# Patient Record
Sex: Female | Born: 1980 | Race: Black or African American | Hispanic: No | Marital: Single | Smoking: Never smoker
Health system: Southern US, Community
[De-identification: ages and names within clinical notes are randomized; demographics above are authoritative.]

## PROBLEM LIST (undated history)

## (undated) ENCOUNTER — Inpatient Hospital Stay (HOSPITAL_COMMUNITY): Payer: Self-pay

## (undated) DIAGNOSIS — F4310 Post-traumatic stress disorder, unspecified: Secondary | ICD-10-CM

## (undated) DIAGNOSIS — F603 Borderline personality disorder: Secondary | ICD-10-CM

---

## 2011-02-26 ENCOUNTER — Ambulatory Visit (HOSPITAL_COMMUNITY)
Admission: AD | Admit: 2011-02-26 | Discharge: 2011-02-26 | Disposition: A | Discharge status: Home / Self Care | Payer: BC Managed Care – PPO | Source: Ambulatory Visit | Attending: Obstetrics and Gynecology | Admitting: Obstetrics and Gynecology

## 2011-02-26 ENCOUNTER — Other Ambulatory Visit: Payer: Self-pay | Admitting: Obstetrics & Gynecology

## 2011-02-26 DIAGNOSIS — O034 Incomplete spontaneous abortion without complication: Secondary | ICD-10-CM

## 2011-02-26 LAB — CBC
HCT: 26.1 % — ABNORMAL LOW (ref 36.0–46.0)
MCH: 29.1 pg (ref 26.0–34.0)
MCV: 88.2 fL (ref 78.0–100.0)
Platelets: 242 10*3/uL (ref 150–400)
RDW: 12.8 % (ref 11.5–15.5)
WBC: 14.8 10*3/uL — ABNORMAL HIGH (ref 4.0–10.5)

## 2011-02-26 LAB — TYPE AND SCREEN
ABO/RH(D): A POS
Antibody Screen: NEGATIVE

## 2011-03-06 NOTE — Op Note (Signed)
  NAMEMICHALINE, KINDIG             ACCOUNT NO.:  [id number]  MEDICAL RECORD NO.:  [id number]           PATIENT TYPE:  O  LOCATION:  [facility]                          [facility]  PHYSICIAN:  Scheryl Darter, MD       DATE OF BIRTH:  1981-11-12  DATE OF PROCEDURE:  02/26/2011 DATE OF DISCHARGE:  02/26/2011                              OPERATIVE REPORT   PROCEDURE:  Suction dilation and curettage.  PREOPERATIVE DIAGNOSIS:  Incomplete abortion at 16 weeks' gestation.  POSTOPERATIVE DIAGNOSIS:  Incomplete abortion at 16 weeks' gestation.  SURGEON:  Scheryl Darter, MD  ANESTHESIA:  MAC with local.  COMPLICATIONS:  None.  SPECIMEN:  Products of conception.  DRAINS:  None.  COUNTS:  Correct.  OPERATIVE COURSE:  The patient gave written consent for suction dilation and curettage.  The patient had spontaneous miscarriage at 52 weeks' gestation and placenta was retained.  Placenta was unable to pass spontaneously after she received Cytotec per rectum.  She gave consent for procedure.  The patient identification was confirmed.  The patient was brought to the OR and she was anesthetized via MAC.  Placed in dorsal lithotomy position.  Exam showed the umbilical port protruding through the vagina and cervix was about 2 cm dilated and placenta was palpable above the cervix.  The patient had some clots in the vagina. Speculum was inserted.  Clots were removed.  Vagina and perineum were sterilely prepped and draped.  Bladder was drained with a red rubber catheter.  Cervix was grasped with a single-tooth tenaculum.  Marcaine 0.5% was infiltrated for intracervical block.  A 14-mm suction curette was used to remove the placenta with suction.  Products of conception were removed.  She received IV Pitocin during the procedure.  Complete evacuation of the uterine cavity was assured.  There was minimal bleeding at the end of the procedure.  Specimen was sent to Pathology.  The patient was sent  in stable condition to recovery room.  Blood type is A positive.     Scheryl Darter, MD     JA/MEDQ  D:  02/26/2011  T:  2011/03/10  Job:  [id number]  Electronically Signed by Scheryl Darter MD on 03/05/2011 01:54:22 PM

## 2011-03-26 ENCOUNTER — Encounter: Payer: BC Managed Care – PPO | Admitting: Physician Assistant

## 2011-03-29 DEATH — deceased

## 2011-06-07 ENCOUNTER — Emergency Department (HOSPITAL_COMMUNITY): Payer: BC Managed Care – PPO

## 2011-06-07 ENCOUNTER — Emergency Department (HOSPITAL_COMMUNITY)
Admission: EM | Admit: 2011-06-07 | Discharge: 2011-06-08 | Disposition: A | Discharge status: Home / Self Care | Payer: BC Managed Care – PPO | Attending: Emergency Medicine | Admitting: Emergency Medicine

## 2011-06-07 DIAGNOSIS — R0602 Shortness of breath: Secondary | ICD-10-CM | POA: Insufficient documentation

## 2011-06-07 DIAGNOSIS — R059 Cough, unspecified: Secondary | ICD-10-CM | POA: Insufficient documentation

## 2011-06-07 DIAGNOSIS — R05 Cough: Secondary | ICD-10-CM | POA: Insufficient documentation

## 2011-06-07 DIAGNOSIS — F3289 Other specified depressive episodes: Secondary | ICD-10-CM | POA: Insufficient documentation

## 2011-06-07 DIAGNOSIS — F329 Major depressive disorder, single episode, unspecified: Secondary | ICD-10-CM | POA: Insufficient documentation

## 2011-06-07 DIAGNOSIS — R799 Abnormal finding of blood chemistry, unspecified: Secondary | ICD-10-CM | POA: Insufficient documentation

## 2011-06-07 DIAGNOSIS — Z79899 Other long term (current) drug therapy: Secondary | ICD-10-CM | POA: Insufficient documentation

## 2011-06-07 LAB — BASIC METABOLIC PANEL
CO2: 23 mEq/L (ref 19–32)
Calcium: 9.8 mg/dL (ref 8.4–10.5)
Creatinine, Ser: 0.71 mg/dL (ref 0.4–1.2)
GFR calc Af Amer: >60 mL/min (ref >60)
GFR calc non Af Amer: >60 mL/min (ref >60)
Glucose, Bld: 96 mg/dL (ref 70–99)
Sodium: 137 mEq/L (ref 135–145)

## 2011-06-07 LAB — CBC
MCH: 20.2 pg — ABNORMAL LOW (ref 26.0–34.0)
MCHC: 27.7 g/dL — ABNORMAL LOW (ref 30.0–36.0)
MCV: 72.9 fL — ABNORMAL LOW (ref 78.0–100.0)
Platelets: 424 10*3/uL — ABNORMAL HIGH (ref 150–400)
RBC: 4.06 MIL/uL (ref 3.87–5.11)

## 2011-06-07 LAB — DIFFERENTIAL
Basophils Absolute: 0 10*3/uL (ref 0.0–0.1)
Eosinophils Absolute: 0.1 10*3/uL (ref 0.0–0.7)
Eosinophils Relative: 1 % (ref 0–5)
Lymphs Abs: 3.6 10*3/uL (ref 0.7–4.0)
Monocytes Relative: 8 % (ref 3–12)
Neutrophils Relative %: 47 % (ref 43–77)

## 2011-06-08 ENCOUNTER — Encounter (HOSPITAL_COMMUNITY): Payer: Self-pay

## 2011-06-08 MED ORDER — IOHEXOL 300 MG/ML  SOLN
100.0000 mL | Freq: Once | INTRAMUSCULAR | Status: AC | PRN
  Administered 2011-06-08: 100 mL via INTRAVENOUS

## 2011-06-30 ENCOUNTER — Institutional Professional Consult (permissible substitution): Payer: BC Managed Care – PPO | Admitting: Pulmonary Disease

## 2011-07-28 ENCOUNTER — Institutional Professional Consult (permissible substitution): Payer: BC Managed Care – PPO | Admitting: Pulmonary Disease

## 2011-08-14 ENCOUNTER — Ambulatory Visit (INDEPENDENT_AMBULATORY_CARE_PROVIDER_SITE_OTHER): Payer: BC Managed Care – PPO | Admitting: Pulmonary Disease

## 2011-08-14 ENCOUNTER — Telehealth: Payer: Self-pay | Admitting: Pulmonary Disease

## 2011-08-14 ENCOUNTER — Encounter: Payer: Self-pay | Admitting: Pulmonary Disease

## 2011-08-14 VITALS — BP 92/60 | HR 74 | Temp 98.5°F | Ht 63.0 in | Wt 151.0 lb

## 2011-08-14 DIAGNOSIS — R0989 Other specified symptoms and signs involving the circulatory and respiratory systems: Secondary | ICD-10-CM

## 2011-08-14 DIAGNOSIS — R0600 Dyspnea, unspecified: Secondary | ICD-10-CM

## 2011-08-14 NOTE — Patient Instructions (Signed)
Your breathing test does not show evidence of asthma today This may be related to your anxiety & grief Sleep study does not show significant obstructive sleep apnea Some limb jerks are noted - please ask Dr Doreene Burke to check your iron levels in blood

## 2011-08-14 NOTE — Progress Notes (Signed)
  Subjective:    Patient ID: Tiffany Floyd, female    DOB: 12-23-1981, 30 y.o.   MRN: [id number]  HPI 30/F for evaluation of episodic dyspnea Reports attacks of shortness of breath with occasional palpitations& tremors, this has woken her up from sleep on occasion paxil given by psych for panic attacks given proventil MDI - which helps No child hood h/o asthma ESS 16 First miscarriage mar '12 at 5 months - still grieving, sees therapist,  Husband not very supportive Spirometry no airway obstruction, FEv1 83% Bedtime 10p, latency , frequent awakenings, tired on wkaing up No snoring, witnesed apneas Sleep study (on trazodone &  Paxil) - no evidence of sleep disordered breathing, limb jerks +,  There is no history suggestive of cataplexy, sleep paralysis or parasomnias   Review of Systems  Constitutional: Negative for fever and unexpected weight change.  HENT: Negative for ear pain, nosebleeds, congestion, sore throat, rhinorrhea, sneezing, trouble swallowing, dental problem, postnasal drip and sinus pressure.   Eyes: Negative for redness and itching.  Respiratory: Negative for cough, chest tightness, shortness of breath and wheezing.   Cardiovascular: Negative for palpitations and leg swelling.  Gastrointestinal: Negative for nausea and vomiting.  Genitourinary: Negative for dysuria.  Musculoskeletal: Negative for joint swelling.  Skin: Negative for rash.  Neurological: Negative for headaches.  Hematological: Bruises/bleeds easily.  Psychiatric/Behavioral: Negative for dysphoric mood. The patient is not nervous/anxious.        Objective:   Physical Exam  Gen. Pleasant, well-nourished, in no distress, normal affect ENT - no lesions, no post nasal drip Neck: No JVD, no thyromegaly, no carotid bruits Lungs: no use of accessory muscles, no dullness to percussion, clear without rales or rhonchi  Cardiovascular: Rhythm regular, heart sounds  normal, no murmurs or gallops, no  peripheral edema Abdomen: soft and non-tender, no hepatosplenomegaly, BS normal. Musculoskeletal: No deformities, no cyanosis or clubbing Neuro:  alert, non focal       Assessment & Plan:

## 2011-08-14 NOTE — Telephone Encounter (Signed)
Per [facility] this pt has never been seen there. Will forward to RA and his nurse so they may track down records if still needing them.

## 2011-08-14 NOTE — Assessment & Plan Note (Signed)
Doubt asthma, feel her symptoms are more related to anxiety , perhaps from her recent loss. Does not have significant  Sleep disordered breathing - few PLms, recommend chk iron levels & correctf or ferritin <50. OK to sue trazodone for short term , depression related insomnia On paxil for anxiety

## 2011-08-14 NOTE — Telephone Encounter (Signed)
Received records

## 2011-08-17 NOTE — Telephone Encounter (Signed)
done

## 2011-08-17 NOTE — Telephone Encounter (Signed)
Dr. Vassie Loll, do you need anything further on this?

## 2011-11-16 ENCOUNTER — Other Ambulatory Visit: Payer: Self-pay | Admitting: Obstetrics and Gynecology

## 2011-11-27 ENCOUNTER — Other Ambulatory Visit: Payer: Self-pay

## 2011-12-15 ENCOUNTER — Other Ambulatory Visit (HOSPITAL_COMMUNITY): Payer: Self-pay | Admitting: Obstetrics and Gynecology

## 2011-12-15 DIAGNOSIS — Z3682 Encounter for antenatal screening for nuchal translucency: Secondary | ICD-10-CM

## 2011-12-24 ENCOUNTER — Encounter (HOSPITAL_COMMUNITY): Payer: Self-pay

## 2011-12-24 ENCOUNTER — Ambulatory Visit (HOSPITAL_COMMUNITY)
Admission: RE | Admit: 2011-12-24 | Discharge: 2011-12-24 | Disposition: A | Discharge status: Home / Self Care | Payer: Medicaid Other | Source: Ambulatory Visit | Attending: Obstetrics and Gynecology | Admitting: Obstetrics and Gynecology

## 2011-12-24 DIAGNOSIS — Z3689 Encounter for other specified antenatal screening: Secondary | ICD-10-CM | POA: Insufficient documentation

## 2011-12-24 DIAGNOSIS — O3510X0 Maternal care for (suspected) chromosomal abnormality in fetus, unspecified, not applicable or unspecified: Secondary | ICD-10-CM | POA: Insufficient documentation

## 2011-12-24 DIAGNOSIS — O351XX0 Maternal care for (suspected) chromosomal abnormality in fetus, not applicable or unspecified: Secondary | ICD-10-CM | POA: Insufficient documentation

## 2011-12-24 DIAGNOSIS — Z3682 Encounter for antenatal screening for nuchal translucency: Secondary | ICD-10-CM

## 2011-12-24 HISTORY — DX: Borderline personality disorder: F60.3

## 2011-12-24 HISTORY — DX: Post-traumatic stress disorder, unspecified: F43.10

## 2011-12-24 NOTE — ED Notes (Signed)
Pt. Denies taking any medications other than prenatal vitamins.

## 2012-01-04 ENCOUNTER — Other Ambulatory Visit: Payer: Self-pay

## 2012-01-18 ENCOUNTER — Other Ambulatory Visit: Payer: Self-pay

## 2012-03-10 ENCOUNTER — Inpatient Hospital Stay (HOSPITAL_COMMUNITY)
Admission: AD | Admit: 2012-03-10 | Discharge: 2012-03-13 | DRG: 765 | Disposition: A | Discharge status: Home / Self Care | Payer: Medicaid Other | Source: Ambulatory Visit | Attending: Obstetrics & Gynecology | Admitting: Obstetrics & Gynecology

## 2012-03-10 ENCOUNTER — Inpatient Hospital Stay (HOSPITAL_COMMUNITY): Payer: Medicaid Other | Admitting: Anesthesiology

## 2012-03-10 ENCOUNTER — Inpatient Hospital Stay (HOSPITAL_COMMUNITY): Payer: Medicaid Other

## 2012-03-10 ENCOUNTER — Encounter (HOSPITAL_COMMUNITY): Payer: Self-pay | Admitting: *Deleted

## 2012-03-10 ENCOUNTER — Encounter (HOSPITAL_COMMUNITY)
Admission: AD | Disposition: A | Discharge status: Home / Self Care | Payer: Self-pay | Source: Ambulatory Visit | Attending: Obstetrics & Gynecology

## 2012-03-10 ENCOUNTER — Encounter (HOSPITAL_COMMUNITY): Payer: Self-pay | Admitting: Anesthesiology

## 2012-03-10 DIAGNOSIS — R0600 Dyspnea, unspecified: Secondary | ICD-10-CM

## 2012-03-10 DIAGNOSIS — O4590 Premature separation of placenta, unspecified, unspecified trimester: Secondary | ICD-10-CM

## 2012-03-10 DIAGNOSIS — O321XX0 Maternal care for breech presentation, not applicable or unspecified: Secondary | ICD-10-CM | POA: Diagnosis present

## 2012-03-10 DIAGNOSIS — O323XX0 Maternal care for face, brow and chin presentation, not applicable or unspecified: Secondary | ICD-10-CM

## 2012-03-10 DIAGNOSIS — O26879 Cervical shortening, unspecified trimester: Secondary | ICD-10-CM

## 2012-03-10 LAB — CBC
HCT: 31.3 % — ABNORMAL LOW (ref 36.0–46.0)
Hemoglobin: 10.2 g/dL — ABNORMAL LOW (ref 12.0–15.0)
MCV: 87.7 fL (ref 78.0–100.0)
RBC: 3.57 MIL/uL — ABNORMAL LOW (ref 3.87–5.11)
WBC: 15.1 10*3/uL — ABNORMAL HIGH (ref 4.0–10.5)

## 2012-03-10 LAB — TYPE AND SCREEN

## 2012-03-10 LAB — DIFFERENTIAL
Eosinophils Relative: 0 % (ref 0–5)
Lymphocytes Relative: 17 % (ref 12–46)
Lymphs Abs: 2.6 10*3/uL (ref 0.7–4.0)
Monocytes Absolute: 1.4 10*3/uL — ABNORMAL HIGH (ref 0.1–1.0)
Monocytes Relative: 10 % (ref 3–12)

## 2012-03-10 LAB — URINALYSIS, ROUTINE W REFLEX MICROSCOPIC
Glucose, UA: NEGATIVE mg/dL
Ketones, ur: NEGATIVE mg/dL
Nitrite: NEGATIVE
Protein, ur: NEGATIVE mg/dL

## 2012-03-10 LAB — URINE MICROSCOPIC-ADD ON

## 2012-03-10 LAB — WET PREP, GENITAL: Trich, Wet Prep: NONE SEEN

## 2012-03-10 LAB — GC/CHLAMYDIA PROBE AMP, GENITAL

## 2012-03-10 SURGERY — Surgical Case
Anesthesia: General | Site: Abdomen | Wound class: Clean Contaminated

## 2012-03-10 MED ORDER — CITRIC ACID-SODIUM CITRATE 334-500 MG/5ML PO SOLN
ORAL | Status: AC
  Administered 2012-03-10: 30 mL via ORAL
  Filled 2012-03-10: qty 15

## 2012-03-10 MED ORDER — FENTANYL CITRATE 0.05 MG/ML IJ SOLN
INTRAMUSCULAR | Status: AC
  Filled 2012-03-10: qty 2

## 2012-03-10 MED ORDER — PENICILLIN G POTASSIUM 5000000 UNITS IJ SOLR
2.5000 10*6.[IU] | INTRAMUSCULAR | Status: DC
  Filled 2012-03-10 (×4): qty 2.5

## 2012-03-10 MED ORDER — ACETAMINOPHEN 325 MG PO TABS: 650.0000 mg | ORAL_TABLET | ORAL | Status: DC | PRN

## 2012-03-10 MED ORDER — OXYTOCIN 20 UNITS IN LACTATED RINGERS INFUSION - SIMPLE
INTRAVENOUS | Status: AC
  Filled 2012-03-10: qty 1000

## 2012-03-10 MED ORDER — DOCUSATE SODIUM 100 MG PO CAPS: 100.0000 mg | ORAL_CAPSULE | Freq: Every day | ORAL | Status: DC

## 2012-03-10 MED ORDER — CALCIUM CARBONATE ANTACID 500 MG PO CHEW: 2.0000 | CHEWABLE_TABLET | ORAL | Status: DC | PRN

## 2012-03-10 MED ORDER — BETAMETHASONE SOD PHOS & ACET 6 (3-3) MG/ML IJ SUSP
12.0000 mg | INTRAMUSCULAR | Status: DC
Start: 2012-03-10 — End: 2012-03-11
  Filled 2012-03-10: qty 2

## 2012-03-10 MED ORDER — PRENATAL MULTIVITAMIN CH: 1.0000 | ORAL_TABLET | Freq: Every day | ORAL | Status: DC

## 2012-03-10 MED ORDER — ZOLPIDEM TARTRATE 10 MG PO TABS: 10.0000 mg | ORAL_TABLET | Freq: Every evening | ORAL | Status: DC | PRN

## 2012-03-10 MED ORDER — MIDAZOLAM HCL 2 MG/2ML IJ SOLN
INTRAMUSCULAR | Status: AC
  Filled 2012-03-10: qty 2

## 2012-03-10 MED ORDER — MAGNESIUM SULFATE 40 G IN LACTATED RINGERS - SIMPLE
2.0000 g/h | INTRAVENOUS | Status: DC
  Filled 2012-03-10: qty 500

## 2012-03-10 MED ORDER — PENICILLIN G POTASSIUM 5000000 UNITS IJ SOLR
5.0000 10*6.[IU] | Freq: Once | INTRAVENOUS | Status: DC
  Filled 2012-03-10: qty 5

## 2012-03-10 MED ORDER — MAGNESIUM SULFATE BOLUS VIA INFUSION
6.0000 g | Freq: Once | INTRAVENOUS | Status: DC
  Filled 2012-03-10: qty 500

## 2012-03-10 SURGICAL SUPPLY — 37 items
CHLORAPREP W/TINT 26ML (MISCELLANEOUS) ×2 IMPLANT
CLOTH BEACON ORANGE TIMEOUT ST (SAFETY) ×2 IMPLANT
DRESSING TELFA 8X3 (GAUZE/BANDAGES/DRESSINGS) IMPLANT
DRSG COVADERM 4X10 (GAUZE/BANDAGES/DRESSINGS) ×2 IMPLANT
ELECT REM PT RETURN 9FT ADLT (ELECTROSURGICAL) ×2
ELECTRODE REM PT RTRN 9FT ADLT (ELECTROSURGICAL) ×1 IMPLANT
EXTRACTOR VACUUM M CUP 4 TUBE (SUCTIONS) IMPLANT
GAUZE SPONGE 4X4 12PLY STRL LF (GAUZE/BANDAGES/DRESSINGS) ×2 IMPLANT
GLOVE BIO SURGEON STRL SZ7 (GLOVE) ×2 IMPLANT
GLOVE BIOGEL PI IND STRL 7.0 (GLOVE) ×2 IMPLANT
GLOVE BIOGEL PI INDICATOR 7.0 (GLOVE) ×2
GLOVE SKINSENSE NS SZ7.5 (GLOVE) ×1
GLOVE SKINSENSE NS SZ8.0 LF (GLOVE) ×1
GLOVE SKINSENSE STRL SZ7.5 (GLOVE) ×1 IMPLANT
GLOVE SKINSENSE STRL SZ8.0 LF (GLOVE) ×1 IMPLANT
GOWN PREVENTION PLUS LG XLONG (DISPOSABLE) ×6 IMPLANT
KIT ABG SYR 3ML LUER SLIP (SYRINGE) ×2 IMPLANT
NEEDLE HYPO 22GX1.5 SAFETY (NEEDLE) ×2 IMPLANT
NEEDLE HYPO 25X5/8 SAFETYGLIDE (NEEDLE) ×2 IMPLANT
NS IRRIG 1000ML POUR BTL (IV SOLUTION) ×2 IMPLANT
PACK C SECTION WH (CUSTOM PROCEDURE TRAY) ×2 IMPLANT
PAD ABD 7.5X8 STRL (GAUZE/BANDAGES/DRESSINGS) IMPLANT
RTRCTR C-SECT PINK 25CM LRG (MISCELLANEOUS) IMPLANT
SLEEVE SCD COMPRESS KNEE LRG (MISCELLANEOUS) IMPLANT
SLEEVE SCD COMPRESS KNEE MED (MISCELLANEOUS) ×2 IMPLANT
STAPLER VISISTAT 35W (STAPLE) ×2 IMPLANT
SUT MNCRL 0 VIOLET CTX 36 (SUTURE) ×2 IMPLANT
SUT MONOCRYL 0 CTX 36 (SUTURE) ×2
SUT PDS AB 0 CT1 27 (SUTURE) IMPLANT
SUT VIC AB 0 CT1 36 (SUTURE) ×4 IMPLANT
SUT VIC AB 2-0 CT1 27 (SUTURE)
SUT VIC AB 2-0 CT1 TAPERPNT 27 (SUTURE) IMPLANT
SUT VIC AB 4-0 KS 27 (SUTURE) IMPLANT
SYR CONTROL 10ML LL (SYRINGE) ×2 IMPLANT
TOWEL OR 17X24 6PK STRL BLUE (TOWEL DISPOSABLE) ×4 IMPLANT
TRAY FOLEY CATH 14FR (SET/KITS/TRAYS/PACK) ×2 IMPLANT
WATER STERILE IRR 1000ML POUR (IV SOLUTION) IMPLANT

## 2012-03-10 NOTE — MAU Provider Note (Signed)
History    Chief Complaint  Patient presents with  . Abdominal Pain   HPI Patient is a 31 yo G4P1021 at [redacted]w[redacted]d presenting to MAU with pelvic pain/pressure and scant bleeding x1 today. Patient states she was at work when the pain started. She went to the toilet and saw a small amount of mucous with blood on toilet paper. She has not had any previous bleeding during this pregnancy. Last intercourse was last week. She has no large loss of fluid or vaginal discharge. She is feeling the baby move. She gets prenatal care at [facility]. She had anatomy scan which did not show previa or other abnormalities.  OB History    Grav Para Term Preterm Abortions TAB SAB Ect Mult Living   4 1 1  0 2 1 1  0 0 1    Patient had preterm labor last year and lost a baby at "4 months"  Past Medical History  Diagnosis Date  . Post traumatic stress disorder (PTSD)   . Explosive personality disorder     Past Surgical History  Procedure Date  . None     History reviewed. No pertinent family history.  History  Substance Use Topics  . Smoking status: Never Smoker   . Smokeless tobacco: Never Used  . Alcohol Use: No    Allergies: No Known Allergies  Prescriptions prior to admission  Medication Sig Dispense Refill  . PARoxetine (PAXIL) 30 MG tablet Take 30 mg by mouth every morning.        Marland Kitchen PRENATAL VITAMINS PO Take by mouth.        . traZODone (DESYREL) 100 MG tablet Take 100 mg by mouth at bedtime.          Review of Systems  Constitutional: Negative for fever and chills.  Respiratory: Negative for shortness of breath.   Cardiovascular: Negative for chest pain.  Gastrointestinal: Negative for nausea and vomiting.  Genitourinary: Negative for dysuria.  Skin: Negative for rash.  Neurological: Negative for headaches.   Physical Exam   Blood pressure 136/65, pulse 93, temperature 98.7 F (37.1 C), resp. rate 18, height 5\' 4"  (1.626 m), weight 76.204 kg (168 lb), SpO2 98.00%.  Physical  Exam  Constitutional: She is oriented to person, place, and time. She appears well-developed and well-nourished. No distress.  HENT:  Head: Normocephalic and atraumatic.  Neck: Normal range of motion.  Cardiovascular: Normal rate and regular rhythm.   No murmur heard. Respiratory: Effort normal and breath sounds normal. She has no wheezes.  GI: Soft.       Gravid. Toco in place.  Genitourinary:       Bulging bag of fluid on speculum exam, unable to see any cervix. Normal vaginal mucosa. Bloody discharge noted.  Musculoskeletal: Normal range of motion. She exhibits no edema and no tenderness.  Neurological: She is alert and oriented to person, place, and time. No cranial nerve deficit.  Skin: Skin is dry. No rash noted.    MAU Course  Procedures Results for orders placed during the hospital encounter of 03/10/12 (from the past 24 hour(s))  URINALYSIS, ROUTINE W REFLEX MICROSCOPIC     Status: Abnormal   Collection Time   03/10/12  9:10 PM      Component Value Range   Color, Urine YELLOW  YELLOW    APPearance CLEAR  CLEAR    Specific Gravity, Urine 1.010  1.005 - 1.030    pH 7.0  5.0 - 8.0    Glucose, UA  NEGATIVE  NEGATIVE (mg/dL)   Hgb urine dipstick LARGE (*) NEGATIVE    Bilirubin Urine NEGATIVE  NEGATIVE    Ketones, ur NEGATIVE  NEGATIVE (mg/dL)   Protein, ur NEGATIVE  NEGATIVE (mg/dL)   Urobilinogen, UA 0.2  0.0 - 1.0 (mg/dL)   Nitrite NEGATIVE  NEGATIVE    Leukocytes, UA MODERATE (*) NEGATIVE   URINE MICROSCOPIC-ADD ON     Status: Abnormal   Collection Time   03/10/12  9:10 PM      Component Value Range   Squamous Epithelial / LPF FEW (*) RARE    WBC, UA 11-20  <3 (WBC/hpf)   RBC / HPF 11-20  <3 (RBC/hpf)   Bacteria, UA FEW (*) RARE    MDM Bulging bag of fluid noted on speculum exam. Maylon Cos CNM called to bedside to also examine patient. GBS, G/Ch, and wet prep done. Placed in trendelenburg; will admit to antenatal    Assessment and Plan  31 yo G4P1021 at  [redacted]w[redacted]d presenting for pelvic pain found to be in preterm labor - Will admit to antenatal unit; attending Dr. Macon Large - Keep on bedrest in trendelenburg position - Plan discussed with Maylon Cos, CNM  Latia Mataya 03/10/2012, 10:24 PM

## 2012-03-10 NOTE — H&P (Signed)
History    Chief Complaint   Patient presents with   .  Abdominal Pain    HPI  Patient is a 31 yo G4P1021 at [redacted]w[redacted]d presenting to MAU with pelvic pain/pressure and scant bleeding x1 today. Patient states she was at work when the pain started. She went to the toilet and saw a small amount of mucous with blood on toilet paper. She has not had any previous bleeding during this pregnancy. Last intercourse was last week. She has no large loss of fluid or vaginal discharge. She is feeling the baby move. She gets prenatal care at [facility]. She had anatomy scan which did not show previa or other abnormalities.   OB History    Grav  Para  Term  Preterm  Abortions  TAB  SAB  Ect  Mult  Living    4  1  1   0  2  1  1   0  0  1     Patient had preterm labor last year and lost a baby at "4 months"  Past Medical History   Diagnosis  Date   .  Post traumatic stress disorder (PTSD)    .  Explosive personality disorder     Past Surgical History   Procedure  Date   .  None     History reviewed. No pertinent family history.  History   Substance Use Topics   .  Smoking status:  Never Smoker   .  Smokeless tobacco:  Never Used   .  Alcohol Use:  No    Allergies: No Known Allergies  Prescriptions prior to admission   Medication  Sig  Dispense  Refill   .  PARoxetine (PAXIL) 30 MG tablet  Take 30 mg by mouth every morning.     Marland Kitchen  PRENATAL VITAMINS PO  Take by mouth.     .  traZODone (DESYREL) 100 MG tablet  Take 100 mg by mouth at bedtime.      Review of Systems  Constitutional: Negative for fever and chills.  Respiratory: Negative for shortness of breath.  Cardiovascular: Negative for chest pain.  Gastrointestinal: Negative for nausea and vomiting.  Genitourinary: Negative for dysuria.  Skin: Negative for rash.  Neurological: Negative for headaches.   Physical Exam   Blood pressure 136/65, pulse 93, temperature 98.7 F (37.1 C), resp. rate 18, height 5\' 4"  (1.626 m), weight 76.204  kg (168 lb), SpO2 98.00%.  Physical Exam  Constitutional: She is oriented to person, place, and time. She appears well-developed and well-nourished. No distress.  HENT:  Head: Normocephalic and atraumatic.  Neck: Normal range of motion.  Cardiovascular: Normal rate and regular rhythm.  No murmur heard.  Respiratory: Effort normal and breath sounds normal. She has no wheezes.  GI: Soft.  Gravid. Toco in place.  Genitourinary:  Bulging bag of fluid on speculum exam, unable to see any cervix. Normal vaginal mucosa. Bloody discharge noted.  Musculoskeletal: Normal range of motion. She exhibits no edema and no tenderness.  Neurological: She is alert and oriented to person, place, and time. No cranial nerve deficit.  Skin: Skin is dry. No rash noted.   MAU Course   Procedures  Results for orders placed during the hospital encounter of 03/10/12 (from the past 24 hour(s))   URINALYSIS, ROUTINE W REFLEX MICROSCOPIC Status: Abnormal    Collection Time    03/10/12 9:10 PM   Component  Value  Range    Color, Urine  YELLOW  YELLOW    APPearance  CLEAR  CLEAR    Specific Gravity, Urine  1.010  1.005 - 1.030    pH  7.0  5.0 - 8.0    Glucose, UA  NEGATIVE  NEGATIVE (mg/dL)    Hgb urine dipstick  LARGE (*)  NEGATIVE    Bilirubin Urine  NEGATIVE  NEGATIVE    Ketones, ur  NEGATIVE  NEGATIVE (mg/dL)    Protein, ur  NEGATIVE  NEGATIVE (mg/dL)    Urobilinogen, UA  0.2  0.0 - 1.0 (mg/dL)    Nitrite  NEGATIVE  NEGATIVE    Leukocytes, UA  MODERATE (*)  NEGATIVE   URINE MICROSCOPIC-ADD ON Status: Abnormal    Collection Time    03/10/12 9:10 PM   Component  Value  Range    Squamous Epithelial / LPF  FEW (*)  RARE    WBC, UA  11-20  <3 (WBC/hpf)    RBC / HPF  11-20  <3 (RBC/hpf)    Bacteria, UA  FEW (*)  RARE    MDM in MAU Bulging bag of fluid noted on speculum exam. Maylon Cos CNM called to bedside to also examine patient.  GBS, G/Ch, and wet prep done.  Placed in trendelenburg; will admit to  antenatal   Assessment and Plan   31 yo G4P1021 at [redacted]w[redacted]d presenting for pelvic pain found to be in preterm labor  - Admit to antenatal unit; attending Dr. Macon Large  - Keep on bedrest in trendelenburg position  - Penicillin for GBS prophylaxis (GBS culture collected today) - Magnesium for tocolysis - Will get ultrasound, and OB panel labs - Patient has signed release of information form to receive prenatal records from [facility]. - Plan discussed with Maylon Cos, CNM  Patrizia Paule 10:43 PM 03/10/2012

## 2012-03-10 NOTE — MAU Note (Signed)
Pt reports she is having "a little pain in my vagina", today one time when she wiped there was blood on the tissue, reports positive fetal movement.

## 2012-03-10 NOTE — Anesthesia Preprocedure Evaluation (Signed)
Anesthesia Evaluation  Patient identified by MRN, date of birth, ID band Patient awake    Reviewed: Allergy & Precautions, H&P , Patient's Chart, lab work & pertinent test results, reviewed documented beta blocker date and time   Airway Mallampati: II TM Distance: >3 FB Neck ROM: full    Dental No notable dental hx.    Pulmonary  breath sounds clear to auscultation  Pulmonary exam normal       Cardiovascular Rhythm:regular Rate:Normal     Neuro/Psych    GI/Hepatic   Endo/Other    Renal/GU      Musculoskeletal   Abdominal   Peds  Hematology   Anesthesia Other Findings   Reproductive/Obstetrics                           Anesthesia Physical Anesthesia Plan  ASA: II and Emergent  Anesthesia Plan: General   Post-op Pain Management:    Induction: Intravenous, Rapid sequence and Cricoid pressure planned  Airway Management Planned: Video Laryngoscope Planned  Additional Equipment:   Intra-op Plan:   Post-operative Plan:   Informed Consent: I have reviewed the patients History and Physical, chart, labs and discussed the procedure including the risks, benefits and alternatives for the proposed anesthesia with the patient or authorized representative who has indicated his/her understanding and acceptance.   Dental Advisory Given and Dental advisory given  Plan Discussed with: CRNA and Surgeon  Anesthesia Plan Comments: (STAT to OR 1, Abruption. Airway looks adequate. No allergies. Weight 186#  Discussed  general anesthesia, rapidly with setting up.)        Anesthesia Quick Evaluation

## 2012-03-10 NOTE — MAU Provider Note (Signed)
Attestation of Attending Supervision of Resident: Evaluation and management procedures were performed by the [facility] Medicine Resident under my supervision.  I have reviewed the resident's note and chart, and I agree with management and plan.  Jaynie Collins, M.D. 03/10/2012 11:00 PM

## 2012-03-10 NOTE — Consult Note (Signed)
Duplicate note - will delete  

## 2012-03-10 NOTE — MAU Note (Signed)
Monitors removed to transfer room 161

## 2012-03-10 NOTE — H&P (Signed)
Attestation of Attending Supervision of Resident: Evaluation and management procedures were performed by the [facility] Medicine Resident under my supervision.  I have reviewed the resident's note and chart, and I agree with management and plan.  Jaynie Collins, M.D. 03/10/2012 10:57 PM

## 2012-03-10 NOTE — Progress Notes (Addendum)
Brylinn Teaney is a 31 y.o. [phone] at [redacted]w[redacted]d by ultrasound admitted for Active PTL, BBOW  Subjective: Called to room secondary to heavy vaginal bleeding and unable to find FHR.  Objective: BP 136/65  Pulse 93  Temp 98.7 F (37.1 C)  Resp 18  Ht 5\' 4"  (1.626 m)  Wt 168 lb (76.204 kg)  BMI 28.84 kg/m2  SpO2 98%      FHT:  Unable to montior UC:   Not monitoring SVE:   Complete/footling breech  Unable to locate fetal hear trate on informal [place] do to low station. Fetal movement noted Labs: Lab Results  Component Value Date   WBC 15.1* 03/10/2012   HGB 10.2* 03/10/2012   HCT 31.3* 03/10/2012   MCV 87.7 03/10/2012   PLT 201 03/10/2012    Assessment / Plan: Active Labor Probable Abruption  Dr. Macon Large in room counseling patient for delivery options. 2-3 attempts at pushing without delivery. Bloody fluid. Pt request c-section. STAT called. Pt to OR. Due to rapid progression and urge need to proceed with c/s prior to steroids were not administered. Judah Carchi E. 03/10/2012, 11:32 PM

## 2012-03-11 ENCOUNTER — Encounter (HOSPITAL_COMMUNITY): Payer: Self-pay | Admitting: *Deleted

## 2012-03-11 LAB — RAPID URINE DRUG SCREEN, HOSP PERFORMED
Amphetamines: NOT DETECTED
Benzodiazepines: NOT DETECTED
Cocaine: NOT DETECTED
Opiates: NOT DETECTED
Tetrahydrocannabinol: NOT DETECTED

## 2012-03-11 LAB — CBC
Hemoglobin: 9.5 g/dL — ABNORMAL LOW (ref 12.0–15.0)
MCH: 28.7 pg (ref 26.0–34.0)
MCV: 88.5 fL (ref 78.0–100.0)
RBC: 3.31 MIL/uL — ABNORMAL LOW (ref 3.87–5.11)

## 2012-03-11 MED ORDER — TRAZODONE HCL 100 MG PO TABS
100.0000 mg | ORAL_TABLET | Freq: Every day | ORAL | Status: DC
  Administered 2012-03-12: 100 mg via ORAL
  Filled 2012-03-11 (×3): qty 1

## 2012-03-11 MED ORDER — HYDROMORPHONE HCL PF 1 MG/ML IJ SOLN
INTRAMUSCULAR | Status: AC
  Filled 2012-03-11: qty 1

## 2012-03-11 MED ORDER — CEFAZOLIN SODIUM 1-5 GM-% IV SOLN
INTRAVENOUS | Status: DC | PRN
  Administered 2012-03-10: 1 g via INTRAVENOUS

## 2012-03-11 MED ORDER — DIPHENHYDRAMINE HCL 50 MG/ML IJ SOLN: 12.5000 mg | Freq: Four times a day (QID) | INTRAMUSCULAR | Status: DC | PRN

## 2012-03-11 MED ORDER — SODIUM CHLORIDE 0.9 % IJ SOLN: 3.0000 mL | INTRAMUSCULAR | Status: DC | PRN

## 2012-03-11 MED ORDER — MENTHOL 3 MG MT LOZG
1.0000 | LOZENGE | OROMUCOSAL | Status: DC | PRN
  Administered 2012-03-12: 3 mg via ORAL
  Filled 2012-03-11: qty 9

## 2012-03-11 MED ORDER — SODIUM CHLORIDE 0.9 % IJ SOLN: 9.0000 mL | INTRAMUSCULAR | Status: DC | PRN

## 2012-03-11 MED ORDER — LIDOCAINE-EPINEPHRINE (PF) 2 %-1:200000 IJ SOLN
INTRAMUSCULAR | Status: AC
  Filled 2012-03-11: qty 20

## 2012-03-11 MED ORDER — NALOXONE HCL 0.4 MG/ML IJ SOLN: 0.4000 mg | INTRAMUSCULAR | Status: DC | PRN

## 2012-03-11 MED ORDER — MIDAZOLAM HCL 5 MG/5ML IJ SOLN
INTRAMUSCULAR | Status: DC | PRN
  Administered 2012-03-10: 2 mg via INTRAVENOUS

## 2012-03-11 MED ORDER — OXYCODONE-ACETAMINOPHEN 5-325 MG PO TABS
1.0000 | ORAL_TABLET | ORAL | Status: DC | PRN
  Administered 2012-03-11 – 2012-03-13 (×8): 2 via ORAL
  Filled 2012-03-11 (×8): qty 2

## 2012-03-11 MED ORDER — OXYTOCIN 10 UNIT/ML IJ SOLN
INTRAMUSCULAR | Status: AC
  Filled 2012-03-11: qty 2

## 2012-03-11 MED ORDER — LIDOCAINE HCL (CARDIAC) 20 MG/ML IV SOLN
INTRAVENOUS | Status: AC
  Filled 2012-03-11: qty 5

## 2012-03-11 MED ORDER — FENTANYL CITRATE 0.05 MG/ML IJ SOLN
INTRAMUSCULAR | Status: DC | PRN
  Administered 2012-03-10 – 2012-03-11 (×3): 100 ug via INTRAVENOUS

## 2012-03-11 MED ORDER — PROPOFOL 10 MG/ML IV EMUL
INTRAVENOUS | Status: AC
  Filled 2012-03-11: qty 20

## 2012-03-11 MED ORDER — 0.9 % SODIUM CHLORIDE (POUR BTL) OPTIME
TOPICAL | Status: DC | PRN
  Administered 2012-03-10: 1000 mL

## 2012-03-11 MED ORDER — PRENATAL MULTIVITAMIN CH
1.0000 | ORAL_TABLET | Freq: Every day | ORAL | Status: DC
  Administered 2012-03-11 – 2012-03-13 (×2): 1 via ORAL
  Filled 2012-03-11 (×3): qty 1

## 2012-03-11 MED ORDER — OXYTOCIN 20 UNITS IN LACTATED RINGERS INFUSION - SIMPLE
INTRAVENOUS | Status: DC | PRN
  Administered 2012-03-10 – 2012-03-11 (×2): 20 [IU] via INTRAVENOUS

## 2012-03-11 MED ORDER — SUCCINYLCHOLINE CHLORIDE 20 MG/ML IJ SOLN
INTRAMUSCULAR | Status: AC
  Filled 2012-03-11: qty 10

## 2012-03-11 MED ORDER — FENTANYL CITRATE 0.05 MG/ML IJ SOLN
INTRAMUSCULAR | Status: AC
  Filled 2012-03-11: qty 2

## 2012-03-11 MED ORDER — SIMETHICONE 80 MG PO CHEW
80.0000 mg | CHEWABLE_TABLET | ORAL | Status: DC | PRN
  Administered 2012-03-12: 80 mg via ORAL

## 2012-03-11 MED ORDER — SODIUM CHLORIDE 0.9 % IV SOLN
250.0000 mL | INTRAVENOUS | Status: DC
  Administered 2012-03-11: 250 mL via INTRAVENOUS

## 2012-03-11 MED ORDER — OXYTOCIN 20 UNITS IN LACTATED RINGERS INFUSION - SIMPLE
125.0000 mL/h | INTRAVENOUS | Status: AC
  Administered 2012-03-11: 125 mL/h via INTRAVENOUS
  Filled 2012-03-11: qty 1000

## 2012-03-11 MED ORDER — LANOLIN HYDROUS EX OINT: 1.0000 "application " | TOPICAL_OINTMENT | CUTANEOUS | Status: DC | PRN

## 2012-03-11 MED ORDER — MAGNESIUM HYDROXIDE 400 MG/5ML PO SUSP: 30.0000 mL | ORAL | Status: DC | PRN

## 2012-03-11 MED ORDER — ZOLPIDEM TARTRATE 5 MG PO TABS
5.0000 mg | ORAL_TABLET | Freq: Every evening | ORAL | Status: DC | PRN
Start: 2012-03-11 — End: 2012-03-13
  Administered 2012-03-12: 5 mg via ORAL
  Filled 2012-03-11: qty 1

## 2012-03-11 MED ORDER — DIPHENHYDRAMINE HCL 25 MG PO CAPS: 25.0000 mg | ORAL_CAPSULE | Freq: Four times a day (QID) | ORAL | Status: DC | PRN

## 2012-03-11 MED ORDER — ONDANSETRON HCL 4 MG PO TABS: 4.0000 mg | ORAL_TABLET | ORAL | Status: DC | PRN

## 2012-03-11 MED ORDER — HYDROMORPHONE HCL PF 1 MG/ML IJ SOLN
0.2500 mg | INTRAMUSCULAR | Status: DC | PRN
  Administered 2012-03-11 (×4): 0.5 mg via INTRAVENOUS

## 2012-03-11 MED ORDER — SUCCINYLCHOLINE CHLORIDE 20 MG/ML IJ SOLN
INTRAMUSCULAR | Status: DC | PRN
  Administered 2012-03-10: 180 mg via INTRAVENOUS

## 2012-03-11 MED ORDER — ONDANSETRON HCL 4 MG/2ML IJ SOLN
INTRAMUSCULAR | Status: AC
  Filled 2012-03-11: qty 2

## 2012-03-11 MED ORDER — WITCH HAZEL-GLYCERIN EX PADS: 1.0000 "application " | MEDICATED_PAD | CUTANEOUS | Status: DC | PRN

## 2012-03-11 MED ORDER — ONDANSETRON HCL 4 MG/2ML IJ SOLN: 4.0000 mg | INTRAMUSCULAR | Status: DC | PRN

## 2012-03-11 MED ORDER — SODIUM CHLORIDE 0.9 % IV SOLN: 250.0000 mL | INTRAVENOUS | Status: DC

## 2012-03-11 MED ORDER — SENNOSIDES-DOCUSATE SODIUM 8.6-50 MG PO TABS
2.0000 | ORAL_TABLET | Freq: Every day | ORAL | Status: DC
  Administered 2012-03-11 – 2012-03-12 (×2): 2 via ORAL

## 2012-03-11 MED ORDER — IBUPROFEN 600 MG PO TABS
600.0000 mg | ORAL_TABLET | Freq: Four times a day (QID) | ORAL | Status: DC
  Administered 2012-03-11 – 2012-03-13 (×8): 600 mg via ORAL
  Filled 2012-03-11 (×10): qty 1

## 2012-03-11 MED ORDER — PHENYLEPHRINE HCL 10 MG/ML IJ SOLN
INTRAMUSCULAR | Status: DC | PRN
  Administered 2012-03-10 (×2): 80 ug via INTRAVENOUS
  Administered 2012-03-10 – 2012-03-11 (×3): 40 ug via INTRAVENOUS

## 2012-03-11 MED ORDER — SODIUM CHLORIDE 0.9 % IJ SOLN
3.0000 mL | Freq: Two times a day (BID) | INTRAMUSCULAR | Status: DC
  Administered 2012-03-11: 3 mL via INTRAVENOUS

## 2012-03-11 MED ORDER — LACTATED RINGERS IV SOLN
INTRAVENOUS | Status: DC | PRN
  Administered 2012-03-10 – 2012-03-11 (×3): via INTRAVENOUS

## 2012-03-11 MED ORDER — PROPOFOL 10 MG/ML IV EMUL
INTRAVENOUS | Status: DC | PRN
  Administered 2012-03-10: 200 mg via INTRAVENOUS

## 2012-03-11 MED ORDER — CEFAZOLIN SODIUM 1-5 GM-% IV SOLN
INTRAVENOUS | Status: AC
  Filled 2012-03-11: qty 50

## 2012-03-11 MED ORDER — ONDANSETRON HCL 4 MG/2ML IJ SOLN
INTRAMUSCULAR | Status: DC | PRN
  Administered 2012-03-11: 4 mg via INTRAVENOUS

## 2012-03-11 MED ORDER — HYDROMORPHONE 0.3 MG/ML IV SOLN
INTRAVENOUS | Status: DC
  Administered 2012-03-11: 2 mL via INTRAVENOUS
  Administered 2012-03-11: 04:00:00 via INTRAVENOUS
  Administered 2012-03-11: 0.9 mg via INTRAVENOUS

## 2012-03-11 MED ORDER — HYDROMORPHONE 0.3 MG/ML IV SOLN
INTRAVENOUS | Status: AC
  Filled 2012-03-11: qty 25

## 2012-03-11 MED ORDER — MEPERIDINE HCL 25 MG/ML IJ SOLN: 6.2500 mg | INTRAMUSCULAR | Status: DC | PRN

## 2012-03-11 MED ORDER — DIBUCAINE 1 % RE OINT: 1.0000 "application " | TOPICAL_OINTMENT | RECTAL | Status: DC | PRN

## 2012-03-11 MED ORDER — OXYTOCIN 10 UNIT/ML IJ SOLN
INTRAMUSCULAR | Status: AC
  Filled 2012-03-11: qty 1

## 2012-03-11 MED ORDER — TETANUS-DIPHTH-ACELL PERTUSSIS 5-2.5-18.5 LF-MCG/0.5 IM SUSP
0.5000 mL | Freq: Once | INTRAMUSCULAR | Status: DC
  Filled 2012-03-11: qty 0.5

## 2012-03-11 MED ORDER — DIPHENHYDRAMINE HCL 12.5 MG/5ML PO ELIX: 12.5000 mg | ORAL_SOLUTION | Freq: Four times a day (QID) | ORAL | Status: DC | PRN

## 2012-03-11 MED ORDER — ONDANSETRON HCL 4 MG/2ML IJ SOLN: 4.0000 mg | Freq: Four times a day (QID) | INTRAMUSCULAR | Status: DC | PRN

## 2012-03-11 MED ORDER — HYDROMORPHONE HCL PF 1 MG/ML IJ SOLN
INTRAMUSCULAR | Status: DC | PRN
  Administered 2012-03-11: 1 mg via INTRAVENOUS

## 2012-03-11 MED ORDER — LIDOCAINE HCL (CARDIAC) 20 MG/ML IV SOLN
INTRAVENOUS | Status: DC | PRN
  Administered 2012-03-10: 40 mg via INTRAVENOUS

## 2012-03-11 NOTE — Progress Notes (Signed)
Clinical Social Work Department PSYCHOSOCIAL ASSESSMENT - MATERNAL/CHILD 03/11/2012  Patient:  Tiffany Floyd,Tiffany Floyd  Account Number:  [id number]  Admit Date:  03/10/2012  Childs Name:   Jordan Lamar Patterson    Clinical Social Worker:  Lindley Hiney, LCSWA   Date/Time:  03/11/2012 12:00 N  Date Referred:  03/11/2012   Referral source  NICU     Referred reason  NICU   Other referral source:    I:  FAMILY / HOME ENVIRONMENT Child's legal guardian:  PARENT  Guardian - Name Guardian - Age Guardian - Address  Cedricka Cordon [address] [place], [place] [address]  Jordan Patterson [address] [place], [place] [address]   Other household support members/support persons Name Relationship DOB  Almeri King DAUGHTER 03/24/05   Other support:   FOB's mother and friends mother    II  PSYCHOSOCIAL DATA Information Source:  Patient Interview  Financial and Community Resources Employment:   [employer]- [employer] worker   Financial resources:  Medicaid If Medicaid - County:  [place]  School / Grade:   Maternity Care Coordinator / Child Services Coordination / Early Interventions:  Cultural issues impacting care:    III  STRENGTHS Strengths  Adequate Resources  Supportive family/friends   Strength comment:    IV  RISK FACTORS AND CURRENT PROBLEMS No Problem Noted:  Y Risk Factor & Current Problem Patient Issue Family Issue Risk Factor / Current Problem Comment  Mental Illness Y N Explosive Personality Disorder, PTSD & Depression   N N     V  SOCIAL WORK ASSESSMENT Sw met with MOB to assess social situation and offer support/resources as needed.  Pt lives with FOB and her daughter.  She appears to be doing well emotionally and in good spirits.  Pt states she is "just happy he came out breathing" when she delivered, as she talked about miscarrying last year (02/26/11).  Pt told Sw that she was grieving last year and sought counseling services at [facility] in [place].  She participated in counseling for several months & was diagnosed with Explosive personality disorder, PTSD and depression.  As a result, she was eventually prescribed Ablify and Trazodone to manage symptoms. She took the medications for approximately 3 months before she conceived.  She reports being able to cope well without the medications. Sw probed more about the Explosive personality disorder diagnose however pt nonchalantly shrugged her shoulders & did not elaborate.  She denies any depression or SI during the pregnancy.  Pt's mental health diagnoses are no longer being treated.  Pt denies any illegal substance use prior to or during pregnancy. UDS is negative, meconium results are pending.  Pt's support system is limited, as all of her family lives in [place].  She is hopeful that her family will come visit now that the baby is born.  She identified her best friend's mother and FOB, as primary support system.  She has not  selected a pediatrician or purchased supplies for the baby.  Pt thinks she may be able to purchase some supplies but may need assistance from [employer].  Sw will make referral.  Sw discussed SSI eligibility and completed packet with pt.  Sw will continue to follow pt and assist throughout the duration NICU admission.      VI SOCIAL WORK PLAN Social Work Plan  Psychosocial Support/Ongoing Assessment of Needs   Type of pt/family education:   If child protective services report - county:   

## 2012-03-11 NOTE — Progress Notes (Signed)
Progress Note (Late Entry)  Was called to evaluate patient at [redacted]w[redacted]d with BBOW, concern about rupture and ongoing bleeding,  feet palpated in vagina.  On evaluation, large amount of blood and fluid seen under the patient and the bed, feet and palpated in forebag.  Bedside ultrasound was not able to visualize fetus.  Given uncertainty about fetus status and likely placental abruption, patient was asked to push.  After two brief attempts, patient was not noted to make any progress and cesarean section was recommended.  A stat cesarean section was called, please see operative note for more details.  Jaynie Collins, M.D. 03/11/2012 12:21 AM

## 2012-03-11 NOTE — Op Note (Signed)
[name] PROCEDURE DATE: 03/10/2012 - 03/11/2012  PREOPERATIVE DIAGNOSIS: Intrauterine pregnancy at  [redacted]w[redacted]d weeks gestation; abruptio placenta, footling breech  POSTOPERATIVE DIAGNOSIS: The same  PROCEDURE: Emergent Primary Low Transverse Cesarean Section  SURGEON:  Dr. Jaynie Collins  ANESTHESIOLOGIST: Jiles Garter, MD  INDICATIONS: Tiffany Floyd is a 31 y.o. [phone] at [redacted]w[redacted]d who was taken for emergent cesarean section secondary to concern about abruptio placenta and footling breech presentation.   The patient gave verbal consent and was immediately taken to the OR.    ANESTHESIA: General INTRAVENOUS FLUIDS: 1800 ml ESTIMATED BLOOD LOSS: 800 ml URINE OUTPUT:  200 ml SPECIMENS: Placenta sent to pathology COMPLICATIONS: None immediate  PROCEDURE IN DETAIL:  The patient was taken to the operating room emergently where general anesthesia was administered and was found to be adequate. She was then placed in a dorsal supine position with a leftward tilt, and prepped and draped in a sterile manner.  A foley catheter was placed into her bladder and attached to constant gravity.  After an adequate timeout was performed, a Pfannenstiel skin incision was made with scalpel and carried through to the underlying layer of fascia. The fascia was incised in the midline, and this incision was extended bilaterally bluntly.  The rectus muscles were also separated bluntly and the peritoneum was entered bluntly.  Attention was turned to the lower uterine segment which was noted to be bulging.  A low transverse hysterotomy was made with a scalpel and extended bilaterally bluntly.  There was a significant amount of bright red blood and clots in the uterus and the placenta was noted to be completely detached from the uterus. There was difficulty in locating the baby with all the blood clots and the detached placenta, but the fetus' legs were able to grasped and fetus was delivered in a breech  presentation. The fetus was a viable female infant.  Apgars 4, 6 and 7, weight 690g.  Intact abrupted placenta, three vessel cord. The cord was clamped and cut and the infant was handed over to awaiting neonatology team.  Normal uterus, fallopian tubes and ovaries bilaterally. The uterus was then cleared of clot and debris.  The hysterotomy was closed with 0 Monocryl in a running locked fashion, and an imbricating layer was also placed with a 0 Monocryl suture. The pelvis was cleared of all clot and debris. Hemostasis was confirmed on all surfaces.  The peritoneum and the muscles were reapproximated using 0 Monocryl interrupted stitches. The fascia was then closed using 0 Vicryl in a running fashion.  The subcutaneous layer was irrigated, then reapproximated with 2-0 plain gut interrupted stitches, and the skin was closed with staples. The patient tolerated the procedure well. Sponge, lap, instrument and needle counts were correct x 2.  She was taken to the recovery room in stable condition.

## 2012-03-11 NOTE — Progress Notes (Signed)
Subjective: Postpartum Day 1: Cesarean Delivery Patient reports incisional pain.    Objective: Vital signs in last 24 hours: Temp:  [97.7 F (36.5 C)-98.7 F (37.1 C)] 97.8 F (36.6 C) (03/15 0544) Pulse Rate:  [80-101] 82  (03/15 0544) Resp:  [13-31] 17  (03/15 0544) BP: (111-138)/(60-73) 111/72 mmHg (03/15 0544) SpO2:  [91 %-99 %] 96 % (03/15 0544) Weight:  [168 lb (76.204 kg)] 168 lb (76.204 kg) (03/15 0321)  Physical Exam:  General: alert, cooperative and no distress Lochia: appropriate Uterine Fundus: appropriately tender Incision: drsg in place: C/D/I DVT Evaluation: No evidence of DVT seen on physical exam.   Basename 03/11/12 0535 03/10/12 2300  HGB 9.5* 10.2*  HCT 29.3* 31.3*    Assessment/Plan: Status post Cesarean section. Doing well postoperatively.  Continue current care.  Harkirat Orozco E. 03/11/2012, 7:28 AM

## 2012-03-11 NOTE — Anesthesia Postprocedure Evaluation (Signed)
  Anesthesia Post-op Note  Patient: Tiffany Floyd  Procedure(s) Performed: Procedure(s) (LRB): CESAREAN SECTION (N/A) Patient is awake and responsive. Pain and nausea are reasonably well controlled. Vital signs are stable and clinically acceptable. Oxygen saturation is clinically acceptable. There are no apparent anesthetic complications at this time. Patient is ready for discharge.

## 2012-03-11 NOTE — Transfer of Care (Signed)
Immediate Anesthesia Transfer of Care Note  Patient: Tiffany Floyd  Procedure(s) Performed: Procedure(s) (LRB): CESAREAN SECTION (N/A)  Patient Location: PACU  Anesthesia Type: General  Level of Consciousness: awake, alert  and oriented  Airway & Oxygen Therapy: Patient Spontanous Breathing and Patient connected to nasal cannula oxygen  Post-op Assessment: Report given to PACU RN and Post -op Vital signs reviewed and stable  Post vital signs: Reviewed and stable  Complications: No apparent anesthesia complications

## 2012-03-12 MED ORDER — PHENOL 1.4 % MT LIQD
1.0000 | OROMUCOSAL | Status: DC | PRN
  Filled 2012-03-12: qty 177

## 2012-03-12 NOTE — Progress Notes (Signed)
LCSW spoke with bedside RN to follow up on patient well being and current plan of care.  RN indicates that she has interacted with MOB and is doing well at this time.  She has agreed to stay overnight.  Plan is to follow up with MOB prior to discharge to assess supports.   Staci Acosta, MSW, LCSW, 03/12/2012 4:19 pm

## 2012-03-12 NOTE — Progress Notes (Signed)
Spiritual Care Note:  I was paged at 1054 to assist Tiffany Floyd with the loss of her child. I arrived at 48. After consult with nurses and with the knowledge that Tiffany Floyd did not wish to "see anybody." I visited with her and had a meaningful conversation.  Tiffany Floyd is very depressed over the condition of her child. She asked "have they pulled the plug?" This gives the impression she had been told that the child was on life support and that support was being withdrawn. Without comment - since I cannot answer medical questions - she said with some anger, "I guess they are waiting on the father!" She was in a near fetal position the entire time we spoke, with covers pulled over her head and speaking in low voice and tone. She confirmed she did not wish to see or speak to anyone. Which was modified when I asked if that included staff. She responded "of course staff, but no one else!" She does not wish to ever see the child again and did not seem attached to the child enough to know anything further about the child. When I asked if I could pray for her she responded with the most animation she had displayed, affirming a need for prayer. My prayer dealt with the issues of sadness and feeling alone - at her request. She asked that I have someone who could tell her if "the plug was pulled" and I later related this to the nursing staff, who said they would take care of telling her. We made a pact that she would eat something to keep her strength up and she would rest if only staff was allowed to enter the room. She agreed, but underscored she did not wish to see the father, or any of her relatives.  In post-visit consults with Physician, Nurses, and Social Worker discussion was based in if she was ready to be released, her general demeanor and her wishes requiring no seeing anyone. See their notes for further details from their professional assessments. I do not feel an immediate release would be advised  because of her detachment from grief.  Perhaps this afternoon will be different  Follow up if I am paged to assist in the care of Tiffany Floyd and if she remains at [facility] through Monday a follow up by other chaplains is advised appropriate.

## 2012-03-12 NOTE — Progress Notes (Signed)
LCSW called by bedside RN to be informed of baby's demise.  Chaplain called and saw MOB.  Care team including Chaplain, RN, LCSW, and MD met to discuss MOB current state.  MOB does not wish to see family members at this time.  MD had met with MOB and relayed that MOB unable to give assurance of reaching out to someone if she experiences SI/HI.  Plan is to give MOB time to process recent loss and have some privacy.  MOB was informed by MD that she was not comfortable discharging MOB without a firm safety plan for MOB's well being.  MOB reportedly expressed understanding and is aware that care team will continue to assess her coping and overall well being throughout her stay.   Staci Acosta, MSW LCSW 03/02/2012, 12:05 pm

## 2012-03-12 NOTE — Discharge Summary (Signed)
Obstetric Discharge Summary Reason for Admission: Vaginal bleeding, pain, abrupted placenta. Patient admitted at 24.3 weeks.  Prenatal Procedures: ultrasound Intrapartum Procedures: breech extraction and cesarean: low cervical, transverse Postpartum Procedures: none Complications-Operative and Postpartum: none Hemoglobin  Date Value Range Status  03/11/2012 9.5* 12.0-15.0 (g/dL) Final     HCT  Date Value Range Status  03/11/2012 29.3* 36.0-46.0 (%) Final    Physical Exam:  General: alert and cooperative Lochia: appropriate Uterine Fundus: firm Incision: healing well, no significant drainage DVT Evaluation: No evidence of DVT seen on physical exam.  Discharge Diagnoses: Premature labor and Abrupted placenta, footling breech.   Discharge Information: Date: 03/12/2012 Activity: pelvic rest Diet: routine Medications: Percocet Condition: stable Instructions: refer to practice specific booklet Discharge to: home   Newborn Data: Live born female  Birth Weight: 1 lb 8.3 oz (690 g) APGAR: 4, 6  Infant expired in NICU yesterday due to to intracranial hemorrhage.   Sosie Gato 03/13/2012, 7:01 AM

## 2012-03-12 NOTE — Progress Notes (Signed)
Patient ID: Tiffany Floyd, female   DOB: Jun 01, 1981, 31 y.o.   MRN: [id number]  Came to evaluate patient as she was asking to go home when she was told that her infant was not going to survive.  Initially, she wanted to go home, but said she was going to be alone.  She has a significant history of depression, and while she denies SI, she would not promise that she would call someone if her mood worsened and she was unsafe.   I gave her some time to process the bad news.  The patient also talked with some family members and saw the father of the baby.  She now says that she wants to stay in the hospital one more night and be discharged tomorrow.  She agrees to see her psychiatrist soon and that she will contact family members if her depression worsens.   Anticipate discharge home tomorrow.   Jovaun Levene 03/12/2012 3:47 PM

## 2012-03-12 NOTE — Progress Notes (Addendum)
Received a call from NICU provider for pt and family to come to baby's bedside.  When pt arrived, Dr. Alison Murray spoke with her about the grave condition of her infant. Pt then refused to stay in the NICU and stated "I just want to be by myself". Staff offered to call family members to provide support and pt declined. Pt also declined to hold infant. Chaplain and SW made aware. Emotional support provided

## 2012-03-12 NOTE — Progress Notes (Signed)
Subjective: Postpartum Day #1: Cesarean Delivery Patient reports incisional pain, tolerating PO, + flatus and no problems voiding.   Patient states her sore throat is bothering her the most. OOB with no difficulty. Minimal bleeding. Patient is breast pumping and would like more assistance with that.  Objective: Vital signs in last 24 hours: Temp:  [97.9 F (36.6 C)-99 F (37.2 C)] 98 F (36.7 C) (03/16 8295) Pulse Rate:  [80-100] 95  (03/16 0632) Resp:  [18-22] 18  (03/16 0632) BP: (102-122)/(64-71) 118/66 mmHg (03/16 0632) SpO2:  [94 %-98 %] 96 % (03/16 0632) FiO2 (%):  [36 %] 36 % (03/15 1049)  Physical Exam:  General: alert, cooperative and no distress Lochia: appropriate Uterine Fundus: firm Incision: healing well DVT Evaluation: No evidence of DVT seen on physical exam.   Basename 03/11/12 0535 03/10/12 2300  HGB 9.5* 10.2*  HCT 29.3* 31.3*    Assessment/Plan: Status post Cesarean section. Doing well postoperatively.  Baby in NICU. Continue current care. Will d/c home in next 24-48 hours. Patient will use breast pump today. Will order throat spray to use as needed.  Ancil Dewan 03/12/2012, 7:45 AM

## 2012-03-13 LAB — CULTURE, BETA STREP (GROUP B ONLY)

## 2012-03-13 MED ORDER — TRAZODONE HCL 100 MG PO TABS: 100.0000 mg | ORAL_TABLET | Freq: Every day | ORAL | Status: DC

## 2012-03-13 MED ORDER — OXYCODONE-ACETAMINOPHEN 5-325 MG PO TABS: 1.0000 | ORAL_TABLET | ORAL | Status: AC | PRN

## 2012-03-13 NOTE — Progress Notes (Signed)
LCSW follow up with care team to assess patient's needs.  Patient reportedly doing as well as expected.  She was reported to be appropriately tearful and adjusting to the loss.  I went to the room to speak with mom and she was sleeping.  Plan is to follow up prior to discharge should it be needed. Staci Acosta, MSW LCSW, 03/13/2012, 9:39 am

## 2012-03-13 NOTE — Progress Notes (Signed)
Staples removed and replaced with 1/2" steri strips.  Pt. Tolerated well.  D/C instructions reviewed with pt.  Pt. States understanding of home care.  No home equipment needed.   D/C'd home with family.

## 2012-03-13 NOTE — Progress Notes (Signed)
Met with patient at bedside to assess strengths, needs and coping related to recent infant loss after having briefly been admitted to NICU.  Patient lives with infant's father and her 32 year old daughter.  Patient has opened up during the course of her stay and has been receptive to staff support.  She has opted to decline guests during her stay, though there were multiple family members who came to the unit desiring to visit and to offer well wishes and support.  Patient's daughter is very eager for her mom to come home, after having spent a few months in [place] with relatives due to a prenatal loss 1 year ago.  Patient acknowledged not being able to meet the emotional needs of caring for her daughter and made a decision to have her cared for by family while mom recovered.  She sought therapy and medication management to grapple with the depression following her previous pregnancy loss, and she acknowledged knowing who to contact should she need that level of support upon discharge.  Patient has been with [employer] for almost a year and is unsure if she qualifies for FMLA.  I discussed ways to utilize HR department and OB office to support needed time off and to communicate those needs.  Patient is unsure if she will take time off, or if she will get back to work as soon as she is able in order to get back into a familiar routine.  Commended patient for considering her options and utilizing her supports.  Provided her information on [employer] should she desire another resource to assist her in the healing process.  Patient was communicative and receptive to information.  Her affect and mood were appropriate for the situation.  She smiled during points in the session and expressed eagerness to get home to her daughter and reconnect with family.  Staci Acosta, MSW LCSW, 03/13/2012, 11:17 pm

## 2012-03-13 NOTE — Discharge Instructions (Signed)
Cesarean Delivery Care After Refer to this sheet in the next few weeks. These instructions provide you with information on caring for yourself after your procedure. Your caregiver may also give you more specific instructions. Your treatment has been planned according to current medical practices, but problems sometimes occur. Call your caregiver if you have any problems or questions after your procedure. HOME CARE INSTRUCTIONS Healing will take time. You will have discomfort, tenderness, swelling, and bruising at the surgery site for a couple of weeks. This is normal and will get better as time goes on. Activity  Rest as much as possible the first 2 weeks.   When possible, have someone help you with your household activities and your baby for 2 to 3 weeks.   Limit your housework and social activity. Increase your activity gradually as your strength returns.   Do not climb stairs more than 2 to 3 times a day.   Do not lift anything heavier than your baby.   Follow your caregiver's instructions about driving a car.   Exercise only as directed by your caregiver.  Nutrition  You may return to your usual diet. Eat a well-balanced diet.   Drink enough fluid to keep your urine clear or pale yellow.   Keep taking your prenatal or multivitamins.   Do not drink alcohol until your caregiver says it is okay.  Elimination You should return to your usual bowel function. If you develop constipation, ask your caregiver about taking a mild laxative that will help you go to the bathroom. Bran foods and fluids help with constipation. Gradually add fruit, vegetables, and bran to your diet.  Hygiene  You may shower, wash your hair, and take tub baths unless your caregiver tells you otherwise.   Continue perineal care until your vaginal bleeding and discharge stops.   Do not douche or use tampons until your caregiver says it is okay.  Fever If you feel feverish or have shaking chills, take your  temperature. The fever may indicate infection. Infections can be treated with antibiotic medicine. Pain Control and Medicine  Only take over-the-counter or prescription medicine as directed by your caregiver. Do not take aspirin. It can cause bleeding.   Do not drive when taking pain medicine.   Talk to your caregiver about restarting or adjusting your normal medicines.  Incision Care  Clean your cut (incision) gently with soap and water, then pat dry.   If your caregiver says it is okay, leave the incision without a bandage (dressing) unless it is draining fluid or irritated.   If you have small adhesive strips across the incision and they do not fall off within 7 days, carefully peel them off.   Check the incision daily for increased redness, drainage, swelling, or separation of skin.   Hug a pillow when coughing or sneezing. This helps to relieve pain.  Vaginal Care You may have a vaginal discharge or bleeding for up to 6 weeks. If the vaginal discharge becomes bright red, bad smelling, heavy in amount, has blood clots, or if you have burning or frequent urination, call your caregiver.If your bleeding slows down and then gets heavier, your body is telling you to slow down and relax more. Sexual Intercourse  Check with your caregiver before resuming sexual activity. Often, after 4 to 6 weeks, if you feel good and are well rested, sexual activity may be resumed. Avoid positions that strain the incision site.   You can become pregnant before you have a period.   If you decide to have sexual intercourse, use birth control if you do not want to become pregnant right away.  Health Practices  Keep all your postpartum appointments as recommended by your caregiver. Generally, your caregiver will want to see you in 2 to 3 weeks.   Continue with your yearly pelvic exams.   Continue monthly self-breast exams and yearly physical exams with a Pap test.  Breast Care  If you are not  breastfeeding and your breasts become tender, hard, or leak milk, you may wear a tight-fitting bra and apply ice to your breasts.   If you are breastfeeding, wear a good support bra.   Call your caregiver if you have breast pain, flu-like symptoms, fever, or hardness and reddening of your breasts.  Postpartum Blues You may have a period of low spirits or "blues" after your baby is born. Discuss your feelings with your partner, family, and friends. This may be caused by the changing hormone levels in your body. You may want to contact your caregiver if this is worrisome. Miscellaneous  Limit wearing support panties or control-top hose.   If you breastfeed, you may not have a period for several months or longer. This is normal for the nursing mother. If you do not menstruate within 6 weeks after you stop breastfeeding, see your caregiver.   If you are not breastfeeding, you can expect to menstruate within 6 to 10 weeks after birth. If you have not started by the 11th week, check with your caregiver.  SEEK MEDICAL CARE IF:   There is swelling, redness, or increasing pain in the wound area.   You have pus coming from the wound.   You notice a bad smell from the wound or surgical dressing.   You have pain, redness, and swelling from the intravenous (IV) site.   Your wound breaks open (the edges are not staying together).   You feel dizzy or feel like fainting.   You develop pain or bleeding when you urinate.   You develop diarrhea.   You develop nausea and vomiting.   You develop abnormal vaginal discharge.   You develop a rash.   You have any type of abnormal reaction or develop an allergy to your medicine.   Your pain is not relieved by your medicine or becomes worse.   Your temperature is 101 F (38.3 C), or is 100.4 F (38 C) taken 2 times in a 4 hour period.  SEEK IMMEDIATE MEDICAL CARE:  You develop a temperature of 102 F (38.9 C) or higher.   You develop abdominal  pain.   You develop chest pain.   You develop shortness of breath.   You faint.   You develop pain, swelling, or redness of your leg.   You develop heavy vaginal bleeding with or without blood clots.  Document Released: 09/05/2002 Document Revised: 12/03/2011 Document Reviewed: 03/11/2011 [facility] Patient Information 2012 [place], [place].Emotional Crisis Part of your problem today may be due to an emotional crisis. Emotional states can cause many different physical signs and symptoms. These may include:  Chest or stomach pain.   Fluttering heartbeat.   Passing out.   Breathing difficulty.   Headaches.   Trembling.   Hot or cold flashes.   Numbness.   Dizziness.   Unusual muscle pain or fatigue.   Insomnia.  When you have other medical problems, they are often made worse by emotional upsets. Emotional crises can increase your stress and anxiety. Finding ways to reduce your  stress level can make you feel better. You will become more capable of dealing with these emotional states. Regular physical exercise such as walking can be very beneficial. Counseling or medicine to treat anxiety or depression may also be needed. See your caregiver if you have further problems or questions about your condition. Document Released: 12/14/2005 Document Revised: 12/03/2011 Document Reviewed: 05/31/2007 [facility] Patient Information 2012 [place], [place].

## 2012-03-15 ENCOUNTER — Encounter (HOSPITAL_COMMUNITY): Payer: Self-pay | Admitting: Obstetrics & Gynecology

## 2012-03-15 NOTE — Progress Notes (Signed)
UR Chart review completed.  

## 2012-04-15 ENCOUNTER — Ambulatory Visit: Payer: Medicaid Other | Admitting: Obstetrics and Gynecology

## 2012-04-21 ENCOUNTER — Ambulatory Visit: Payer: Medicaid Other | Admitting: Physician Assistant

## 2012-05-11 ENCOUNTER — Ambulatory Visit: Payer: Medicaid Other | Admitting: Advanced Practice Midwife

## 2012-06-13 ENCOUNTER — Ambulatory Visit: Payer: Medicaid Other | Admitting: Obstetrics and Gynecology

## 2012-09-12 ENCOUNTER — Emergency Department (HOSPITAL_COMMUNITY)
Admission: EM | Admit: 2012-09-12 | Discharge: 2012-09-12 | Discharge status: Against Medical Advice | Payer: Self-pay | Attending: Emergency Medicine | Admitting: Emergency Medicine

## 2012-09-12 ENCOUNTER — Encounter (HOSPITAL_COMMUNITY): Payer: Self-pay | Admitting: Emergency Medicine

## 2012-09-12 DIAGNOSIS — Z3201 Encounter for pregnancy test, result positive: Secondary | ICD-10-CM | POA: Insufficient documentation

## 2012-09-12 DIAGNOSIS — Z3490 Encounter for supervision of normal pregnancy, unspecified, unspecified trimester: Secondary | ICD-10-CM

## 2012-09-12 LAB — URINE MICROSCOPIC-ADD ON

## 2012-09-12 LAB — URINALYSIS, ROUTINE W REFLEX MICROSCOPIC
Glucose, UA: NEGATIVE mg/dL
Ketones, ur: NEGATIVE mg/dL
Specific Gravity, Urine: 1.02 (ref 1.005–1.030)
pH: 7 (ref 5.0–8.0)

## 2012-09-12 LAB — POCT PREGNANCY, URINE: Preg Test, Ur: POSITIVE — AB

## 2012-09-12 NOTE — ED Notes (Signed)
Pt sts vaginal spotting after intercourse today; pt sts positive pregnancy test 2 weeks ago; pt sts stopped taking birth control several months ago and delivered a pre term baby that did not survive 6 months ago; pt denies having LMP

## 2012-09-12 NOTE — ED Provider Notes (Signed)
I have reviewed the pt's labs and do note the positive pregnancy test.  Although I assigned myself to this patient, she left the emergency department prior to me ever having the opportunity to obtained a history or physical exam.  Charge nurse has been made aware.  Left without being seen - elopement.  Tobin Chad, MD 09/12/12 2039

## 2012-09-13 ENCOUNTER — Inpatient Hospital Stay (HOSPITAL_COMMUNITY)
Admission: AD | Admit: 2012-09-13 | Discharge: 2012-09-13 | Disposition: A | Discharge status: Home / Self Care | Payer: Self-pay | Source: Ambulatory Visit | Attending: Obstetrics & Gynecology | Admitting: Obstetrics & Gynecology

## 2012-09-13 ENCOUNTER — Encounter (HOSPITAL_COMMUNITY): Payer: Self-pay | Admitting: *Deleted

## 2012-09-13 ENCOUNTER — Inpatient Hospital Stay (HOSPITAL_COMMUNITY): Payer: Self-pay

## 2012-09-13 DIAGNOSIS — A599 Trichomoniasis, unspecified: Secondary | ICD-10-CM

## 2012-09-13 DIAGNOSIS — O98819 Other maternal infectious and parasitic diseases complicating pregnancy, unspecified trimester: Secondary | ICD-10-CM | POA: Insufficient documentation

## 2012-09-13 DIAGNOSIS — A499 Bacterial infection, unspecified: Secondary | ICD-10-CM | POA: Insufficient documentation

## 2012-09-13 DIAGNOSIS — A5901 Trichomonal vulvovaginitis: Secondary | ICD-10-CM | POA: Insufficient documentation

## 2012-09-13 DIAGNOSIS — N760 Acute vaginitis: Secondary | ICD-10-CM | POA: Insufficient documentation

## 2012-09-13 DIAGNOSIS — O4690 Antepartum hemorrhage, unspecified, unspecified trimester: Secondary | ICD-10-CM

## 2012-09-13 DIAGNOSIS — Z3490 Encounter for supervision of normal pregnancy, unspecified, unspecified trimester: Secondary | ICD-10-CM

## 2012-09-13 DIAGNOSIS — B9689 Other specified bacterial agents as the cause of diseases classified elsewhere: Secondary | ICD-10-CM | POA: Insufficient documentation

## 2012-09-13 DIAGNOSIS — O2390 Unspecified genitourinary tract infection in pregnancy, unspecified trimester: Secondary | ICD-10-CM | POA: Insufficient documentation

## 2012-09-13 DIAGNOSIS — O209 Hemorrhage in early pregnancy, unspecified: Secondary | ICD-10-CM | POA: Insufficient documentation

## 2012-09-13 LAB — CBC
HCT: 35 % — ABNORMAL LOW (ref 36.0–46.0)
Hemoglobin: 11.5 g/dL — ABNORMAL LOW (ref 12.0–15.0)
MCV: 90.2 fL (ref 78.0–100.0)
RBC: 3.88 MIL/uL (ref 3.87–5.11)
RDW: 13.9 % (ref 11.5–15.5)
WBC: 9.9 10*3/uL (ref 4.0–10.5)

## 2012-09-13 MED ORDER — METRONIDAZOLE 500 MG PO TABS: 500.0000 mg | ORAL_TABLET | Freq: Two times a day (BID) | ORAL | Status: DC

## 2012-09-13 NOTE — MAU Note (Signed)
States had bleeding after intercourse Sunday. Bleeding has stopped. Still wants to know if everything is OK.  Went to [facility] yesterday. Had U/A and UPT, but did not wait to be seen. No bleeding currently.

## 2012-09-13 NOTE — ED Provider Notes (Signed)
History     CSN: [id number]  Arrival date and time: 09/13/12 1357   None     Chief Complaint  Patient presents with  . Vaginal Bleeding   The history is provided by the nursing home. The history is limited by the condition of the patient.   Patient is a [id number] at 8.4 weeks by [place] with a history of multiple miscarriages complaining of light vaginal bleeding (spotting) after intercourse in the afternoon yesterday.  She denies any traumatic event during the intercourse.  She went to the ER at [facility] last night but left due to the wait. She denies abdominal pain, maloderous discharge,  vaginal puritis, or dysuria.   OB History    Grav Para Term Preterm Abortions TAB SAB Ect Mult Living   6 2 0 2 2 1 1 0 0 1       Past Medical History  Diagnosis Date  . Post traumatic stress disorder (PTSD)   . Explosive personality disorder     Past Surgical History  Procedure Date  . None   . No past surgeries   . Cesarean section 03/10/2012    Procedure: CESAREAN SECTION;  Surgeon: Tereso Newcomer, MD;  Location: [facility];  Service: Gynecology;  Laterality: N/A;  Primary Cesarean Section Delivery Boy @ 2344,    Family History  Problem Relation Age of Onset  . Anesthesia problems Neg Hx     History  Substance Use Topics  . Smoking status: Never Smoker   . Smokeless tobacco: Never Used  . Alcohol Use: No    Allergies: No Known Allergies  Prescriptions prior to admission  Medication Sig Dispense Refill  . Prenatal Vit-Fe Fumarate-FA (PRENATAL MULTIVITAMIN) TABS Take 1 tablet by mouth daily.        ROS No cp, sob, nausea, vomiting, dizzyness, headache, numbness, or other GU/GI symptoms.  Physical Exam   Blood pressure 145/76, pulse 81, temperature 98.1 F (36.7 C), temperature source Oral, resp. rate 18, height 5\' 4"  (1.626 m), weight 162 lb (73.483 kg), SpO2 100.00%, unknown if currently breastfeeding.  Physical Exam  Constitutional: She is oriented to person, place, and  time. She appears well-developed and well-nourished.  HENT:  Head: Normocephalic and atraumatic.  Right Ear: External ear normal.  Left Ear: External ear normal.  Cardiovascular: Normal rate, regular rhythm, S1 normal, S2 normal, normal heart sounds and intact distal pulses.  Exam reveals no gallop, no S3, no S4 and no friction rub.   No murmur heard. Respiratory: Effort normal and breath sounds normal. No respiratory distress. She has no wheezes. She has no rales. She exhibits no tenderness.  GI: Soft. Normal appearance and bowel sounds are normal. She exhibits no distension and no mass. There is no hepatosplenomegaly. There is no tenderness. There is no rebound and no guarding.  Genitourinary: Pelvic exam was performed with patient supine.       External vagina normal with no lesions. Vaginal vault is clear with no visible blood or trauma noted in the vault.  Cervix is closed and long with trace blood and mucopurulent discharge. No adnexal tenderness. Uterus is approx 10 weeks in size. Performed with chaperone.  Neurological: She is alert and oriented to person, place, and time.  Skin: Skin is warm and dry. No rash noted. No erythema. No pallor.   Results for orders placed during the hospital encounter of 09/13/12 (from the past 24 hour(s))  HCG, QUANTITATIVE, PREGNANCY     Status: Abnormal   Collection  Time   09/13/12  3:20 PM      Component Value Range   hCG, Beta Chain, Mahalia Longest 161096 (*) <5 mIU/mL  CBC     Status: Abnormal   Collection Time   09/13/12  3:20 PM      Component Value Range   WBC 9.9  4.0 - 10.5 K/uL   RBC 3.88  3.87 - 5.11 MIL/uL   Hemoglobin 11.5 (*) 12.0 - 15.0 g/dL   HCT 04.5 (*) 40.9 - 81.1 %   MCV 90.2  78.0 - 100.0 fL   MCH 29.6  26.0 - 34.0 pg   MCHC 32.9  30.0 - 36.0 g/dL   RDW 91.4  78.2 - 95.6 %   Platelets 262  150 - 400 K/uL  WET PREP, GENITAL     Status: Abnormal   Collection Time   09/13/12  4:25 PM      Component Value Range   Yeast Wet Prep HPF  POC NONE SEEN  NONE SEEN   Trich, Wet Prep FEW (*) NONE SEEN   Clue Cells Wet Prep HPF POC MODERATE (*) NONE SEEN   WBC, Wet Prep HPF POC FEW (*) NONE SEEN   MAU Course  Procedures  US shows IUP @ 8.[redacted] weeks gestation, small fibroid 1.8cm, FHR 174 Pelvic exam with GC/Chlamydia + wet prep Assessment: 31 y.o. female @ 8.[redacted] weeks gestation with vaginal bleeding   Trichomonas vaginosis   BV  Plan:  Treat trich and BV   Discussed importance of partner treatment   Follow up with [facility] to start prenatal care, return as needed. Doran Heater 09/13/2012, 4:49 PM  I have reviewed this patient's vital signs, nurses notes, appropriate labs and imaging. I have examined the patient with the student and assisted with dx and plan of care.  Discussed with the patient and all questioned fully answered. She will return if any problems arise.   Medication List     As of 09/13/2012  5:16 PM    START taking these medications         metroNIDAZOLE 500 MG tablet   Commonly known as: FLAGYL   Take 1 tablet (500 mg total) by mouth 2 (two) times daily.      CONTINUE taking these medications         prenatal multivitamin Tabs          Where to get your medications    These are the prescriptions that you need to pick up.   You may get these medications from any pharmacy.         metroNIDAZOLE 500 MG tablet            [facility], [place] 09/13/12 1717

## 2012-09-14 LAB — GC/CHLAMYDIA PROBE AMP, GENITAL: GC Probe Amp, Genital: NEGATIVE

## 2012-09-28 LAB — OB RESULTS CONSOLE PLATELET COUNT: Platelets: 228 10*3/uL

## 2012-09-28 LAB — OB RESULTS CONSOLE ANTIBODY SCREEN: Antibody Screen: NEGATIVE

## 2012-09-28 LAB — CYSTIC FIBROSIS DIAGNOSTIC STUDY: Interpretation-CFDNA:: NEGATIVE

## 2012-09-28 LAB — OB RESULTS CONSOLE RUBELLA ANTIBODY, IGM: Rubella: IMMUNE

## 2012-09-28 LAB — OB RESULTS CONSOLE VARICELLA ZOSTER ANTIBODY, IGG: Varicella: IMMUNE

## 2012-09-28 LAB — OB RESULTS CONSOLE HGB/HCT, BLOOD: Hemoglobin: 11.2 g/dL

## 2012-09-28 LAB — OB RESULTS CONSOLE HEPATITIS B SURFACE ANTIGEN: Hepatitis B Surface Ag: NEGATIVE

## 2012-09-28 LAB — OB RESULTS CONSOLE ABO/RH: RH Type: POSITIVE

## 2012-09-28 LAB — OB RESULTS CONSOLE RPR: RPR: NONREACTIVE

## 2012-09-28 LAB — OB RESULTS CONSOLE GC/CHLAMYDIA: Gonorrhea: NEGATIVE

## 2012-09-28 LAB — GLUCOSE TOLERANCE, 1 HOUR (50G) W/O FASTING: Glucose, GTT - 1 Hour: 103 mg/dL (ref ?–200)

## 2012-10-06 ENCOUNTER — Other Ambulatory Visit: Payer: Self-pay | Admitting: Obstetrics & Gynecology

## 2012-10-06 ENCOUNTER — Ambulatory Visit (INDEPENDENT_AMBULATORY_CARE_PROVIDER_SITE_OTHER): Payer: Medicaid Other | Admitting: Obstetrics & Gynecology

## 2012-10-06 ENCOUNTER — Encounter: Payer: Self-pay | Admitting: *Deleted

## 2012-10-06 ENCOUNTER — Encounter: Payer: Self-pay | Admitting: Obstetrics & Gynecology

## 2012-10-06 VITALS — BP 116/67 | Temp 98.5°F | Wt 163.4 lb

## 2012-10-06 DIAGNOSIS — O34219 Maternal care for unspecified type scar from previous cesarean delivery: Secondary | ICD-10-CM

## 2012-10-06 DIAGNOSIS — O09899 Supervision of other high risk pregnancies, unspecified trimester: Secondary | ICD-10-CM

## 2012-10-06 DIAGNOSIS — Z98891 History of uterine scar from previous surgery: Secondary | ICD-10-CM

## 2012-10-06 DIAGNOSIS — Z3682 Encounter for antenatal screening for nuchal translucency: Secondary | ICD-10-CM

## 2012-10-06 NOTE — Progress Notes (Signed)
Pulse- 94 

## 2012-10-06 NOTE — Progress Notes (Signed)
Appointment with MFM 10/11/12 at 830 am.

## 2012-10-06 NOTE — Patient Instructions (Signed)
Preventing Preterm Labor Preterm labor is when a pregnant woman has contractions that cause the cervix to open, shorten, and thin before 37 weeks of pregnancy. You will have regular contractions (tightening) 2 to 3 minutes apart. This usually causes discomfort or pain. HOME CARE  Eat a healthy diet.  Take your vitamins as told by your doctor.  Drink enough fluids to keep your pee (urine) clear or pale yellow every day.  Get rest and sleep.  Do not have sex if you are at high risk for preterm labor.  Follow your doctor's advice about activity, medicines, and tests.  Avoid stress.  Avoid hard labor or exercise that lasts for a long time. Do not smoke.Vaginal Birth After Cesarean Delivery Vaginal birth after Cesarean delivery (VBAC) is giving birth vaginally after previously delivering a baby by a cesarean. In the past, if a woman had a Cesarean delivery, all births afterwards would be done by Cesarean delivery. This is no longer true. It can be safe for the mother to try a vaginal delivery after having a Cesarean. The final decision to have a VBAC or repeat Cesarean delivery should be between the patient and her caregiver. The risks and benefits can be discussed relative to the reason for, and the type of the previous Cesarean delivery. WOMEN WHO PLAN TO HAVE A VBAC SHOULD CHECK WITH THEIR DOCTOR TO BE SURE THAT: The previous Cesarean was done with a low transverse uterine incision (not a vertical classical incision). The birth canal is big enough for the baby. There were no other operations on the uterus. They will have an electronic fetal monitor (EFM) on at all times during labor. An operating room would be available and ready in case an emergency Cesarean is needed. A doctor and surgical nursing staff would be available at all times during labor to be ready to do an emergency Cesarean if necessary. An anesthesiologist would be present in case an emergency Cesarean is needed. The  nursery is prepared and has adequate personnel and necessary equipment available to care for the baby in case of an emergency Cesarean. BENEFITS OF VBAC: Shorter stay in the hospital. Lower delivery, nursery and hospital costs. Less blood loss and need for blood transfusions. Less fever and discomfort from major surgery. Lower risk of blood clots. Lower risk of infection. Shorter recovery after going home. Lower risk of other surgical complications, such as opening of the incision or hernia in the incision. Decreased risk of injury to other organs. Decreased risk for having to remove the uterus (hysterectomy). Decreased risk for the placenta to completely or partially cover the opening of the uterus (placenta previa) with a future pregnancy. Ability to have a larger family if desired. RISKS OF A VBAC: Rupture of the uterus. Having to remove the uterus (hysterectomy) if it ruptures. All the complications of major surgery and/or injury to other organs. Excessive bleeding, blood clots and infection. Lower Apgar scores (method to evaluate the newborn based on appearance, pulse, grimace, activity, and respiration) and more risks to the baby. There is a higher risk of uterine rupture if you induce or augment labor. There is a higher risk of uterine rupture if you use medications to ripen the cervix. VBAC SHOULD NOT BE DONE IF: The previous Cesarean was done with a vertical (classical) or T-shaped incision, or you do not know what kind of an incision was made. You had a ruptured uterus. You had surgery on your uterus. You have medical or obstetrical problems. There are  problems with the baby. There were two previous Cesarean deliveries and no vaginal deliveries. OTHER FACTS TO KNOW ABOUT VBAC: It is safe to have an epidural anesthetic with VBAC. It is safe to turn the baby from a breech position (attempt an external cephalic version). It is safe to try a VBAC with twins. Pregnancies later  than 40 weeks have not been successful with VBAC. There is an increased failure rate of a VBAC in obese pregnant women. There is an increased failure rate with VABC if the baby weighs 8.8 pounds (4000 grams) or more. There is an increased failure rate if the time between the Cesarean and VBAC is less than 19 months. There is an increased failure rate if pre-eclampsia is present (high blood pressure, protein in the urine and swelling of face and extremities). VBAC is very successful if there was a previous vaginal birth. VBAC is very successful when the labor starts spontaneously before the due date. Delivery of VBAC is similar to having a normal spontaneous vaginal delivery. It is important to discuss VBAC with your caregiver early in the pregnancy so you can understand the risks, benefits and options. It will give you time to decide what is best in your particular case relevant to the reason for your previous Cesarean delivery. It should be understood that medical changes in the mother or pregnancy may occur during the pregnancy, which make it necessary to change you or your caregiver's initial decision. The counseling, concerns and decisions should be documented in the medical record and signed by all parties. Document Released: 06/06/2007 Document Revised: 03/07/2012 Document Reviewed: 01/25/2009 [facility] Patient Information 2013 [place], [place].   GET HELP RIGHT AWAY IF:   You are having contractions.  You have belly (abdominal) pain.  You have bleeding from your vagina.  You have pain when you pee (urinate).  You have abnormal discharge from your vagina.  You have a temperature by mouth above 102 F (38.9 C). MAKE SURE YOU:  Understand these instructions.  Will watch your condition.  Will get help if you are not doing well or get worse. Document Released: 03/12/2009 Document Revised: 03/07/2012 Document Reviewed: 03/12/2009 [facility] Patient Information 2013 [place],  [place].

## 2012-10-06 NOTE — Progress Notes (Signed)
Referred from [facility] with H/O PTB 03/12/11 emergency CS breech PTL 24 weeks possible abruption. Desires TOLAC. Schedule MFM consult, NT  Subjective:    Tiffany Floyd is a [id number] [redacted]w[redacted]d being seen today for her first obstetrical visit.  Her obstetrical history is significant for preterm birth at 24 weeks 3 days, neonatal death. Patient does intend to breast feed. Pregnancy history fully reviewed.  Patient reports no complaints.  Filed Vitals:   10/06/12 1013  BP: 116/67  Temp: 98.5 F (36.9 C)  Weight: 163 lb 6.4 oz (74.118 kg)    HISTORY: OB History    Grav Para Term Preterm Abortions TAB SAB Ect Mult Living   5 2 1 1 2 1 1  0 0 1     # Outc Date GA Lbr Len/2nd Wgt Sex Del Anes PTL Lv   1 SAB 2006 [redacted]w[redacted]d   F       2 TRM 3/06 [redacted]w[redacted]d  5lb(2.268kg) F SVD None Yes Yes   Comments: PROM   3 PRE 3/13 [redacted]w[redacted]d   M LTCS  Yes ND   Comments: PROM, had PP Depression   4 TAB            5 CUR              Past Medical History  Diagnosis Date  . Post traumatic stress disorder (PTSD)   . Explosive personality disorder    Past Surgical History  Procedure Date  . None   . No past surgeries   . Cesarean section 03/10/2012    Procedure: CESAREAN SECTION;  Surgeon: Tereso Newcomer, MD;  Location: [facility];  Service: Gynecology;  Laterality: N/A;  Primary Cesarean Section Delivery Boy @ 2344,   Family History  Problem Relation Age of Onset  . Anesthesia problems Neg Hx      Exam   Filed Vitals:   10/06/12 1013  BP: 116/67  Temp: 98.5 F (36.9 C)   Pleasant, mildly anxious, NAD  Uterus:  Fundal Height: 12 cm  Pelvic Exam:                                    Skin: normal coloration and turgor, no rashes    Neurologic: oriented, normal   Extremities: normal strength, tone, and muscle mass       Mouth/Teeth mucous membranes moist, pharynx normal without lesions and dental hygiene good   Neck supple       Respiratory:  appears well, vitals normal, no respiratory distress,  acyanotic, normal RR   Abdomen: soft, non-tender; bowel sounds normal; no masses,  no organomegaly     Assessment:    Pregnancy: Tiffany Floyd  Diagnosis  . Dyspnea  . Supervision of other high-risk pregnancy  . Previous cesarean section        Plan:     Initial labs drawn.at [facility] Prenatal vitamins. Problem Floyd reviewed and updated. Genetic Screening discussed First Screen: requested.  Ultrasound discussed; fetal survey: requested.MFM consult  Follow up in 3 weeks. 50% of 30 min visit spent on counseling and coordination of care.  Consider 17P 16 weeks   Wants TOLAC ARNOLD,JAMES 10/06/2012

## 2012-10-11 ENCOUNTER — Ambulatory Visit (HOSPITAL_COMMUNITY)
Admission: RE | Admit: 2012-10-11 | Discharge: 2012-10-11 | Disposition: A | Discharge status: Home / Self Care | Payer: Medicaid Other | Source: Ambulatory Visit | Attending: Family Medicine | Admitting: Family Medicine

## 2012-10-11 ENCOUNTER — Ambulatory Visit (HOSPITAL_COMMUNITY)
Admission: RE | Admit: 2012-10-11 | Discharge: 2012-10-11 | Disposition: A | Discharge status: Home / Self Care | Payer: Medicaid Other | Source: Ambulatory Visit | Attending: Obstetrics & Gynecology | Admitting: Obstetrics & Gynecology

## 2012-10-11 ENCOUNTER — Other Ambulatory Visit: Payer: Self-pay

## 2012-10-11 DIAGNOSIS — O3510X0 Maternal care for (suspected) chromosomal abnormality in fetus, unspecified, not applicable or unspecified: Secondary | ICD-10-CM | POA: Insufficient documentation

## 2012-10-11 DIAGNOSIS — O351XX0 Maternal care for (suspected) chromosomal abnormality in fetus, not applicable or unspecified: Secondary | ICD-10-CM | POA: Insufficient documentation

## 2012-10-11 DIAGNOSIS — Z3689 Encounter for other specified antenatal screening: Secondary | ICD-10-CM | POA: Insufficient documentation

## 2012-10-11 DIAGNOSIS — Z8751 Personal history of pre-term labor: Secondary | ICD-10-CM | POA: Insufficient documentation

## 2012-10-11 DIAGNOSIS — O09899 Supervision of other high risk pregnancies, unspecified trimester: Secondary | ICD-10-CM

## 2012-10-11 DIAGNOSIS — O34219 Maternal care for unspecified type scar from previous cesarean delivery: Secondary | ICD-10-CM | POA: Insufficient documentation

## 2012-10-11 DIAGNOSIS — O09299 Supervision of pregnancy with other poor reproductive or obstetric history, unspecified trimester: Secondary | ICD-10-CM | POA: Insufficient documentation

## 2012-10-17 DIAGNOSIS — O09899 Supervision of other high risk pregnancies, unspecified trimester: Secondary | ICD-10-CM

## 2012-11-03 ENCOUNTER — Encounter: Payer: Self-pay | Admitting: Advanced Practice Midwife

## 2012-11-03 ENCOUNTER — Ambulatory Visit (INDEPENDENT_AMBULATORY_CARE_PROVIDER_SITE_OTHER): Payer: Self-pay | Admitting: Obstetrics & Gynecology

## 2012-11-03 VITALS — BP 108/64 | Temp 97.0°F | Wt 161.3 lb

## 2012-11-03 DIAGNOSIS — O34219 Maternal care for unspecified type scar from previous cesarean delivery: Secondary | ICD-10-CM

## 2012-11-03 DIAGNOSIS — O0990 Supervision of high risk pregnancy, unspecified, unspecified trimester: Secondary | ICD-10-CM

## 2012-11-03 DIAGNOSIS — Z98891 History of uterine scar from previous surgery: Secondary | ICD-10-CM

## 2012-11-03 LAB — POCT URINALYSIS DIP (DEVICE)
Glucose, UA: NEGATIVE mg/dL
Hgb urine dipstick: NEGATIVE
Nitrite: NEGATIVE
Protein, ur: 30 mg/dL — AB
Urobilinogen, UA: 1 mg/dL (ref 0.0–1.0)

## 2012-11-03 MED ORDER — HYDROXYPROGESTERONE CAPROATE 250 MG/ML IM OIL: 250.0000 mg | TOPICAL_OIL | INTRAMUSCULAR | Status: DC

## 2012-11-03 NOTE — Progress Notes (Signed)
Pt needs MFM consult.  Cervix today is closed with tone.  Pt may need thrombophilia work up due to abruption, but there was evidence of chorio on pathology which can cause abruption.  Pt will start 17-P shots today or in near future if medication is not available.

## 2012-11-03 NOTE — Progress Notes (Signed)
P = 76 

## 2012-11-08 ENCOUNTER — Ambulatory Visit (HOSPITAL_COMMUNITY)
Admission: RE | Admit: 2012-11-08 | Discharge: 2012-11-08 | Disposition: A | Discharge status: Home / Self Care | Payer: Medicaid Other | Source: Ambulatory Visit | Attending: Obstetrics & Gynecology | Admitting: Obstetrics & Gynecology

## 2012-11-08 DIAGNOSIS — O09899 Supervision of other high risk pregnancies, unspecified trimester: Secondary | ICD-10-CM

## 2012-11-08 DIAGNOSIS — O09299 Supervision of pregnancy with other poor reproductive or obstetric history, unspecified trimester: Secondary | ICD-10-CM | POA: Insufficient documentation

## 2012-11-08 LAB — ANTITHROMBIN III: AntiThromb III Func: 105 % (ref 75–120)

## 2012-11-08 NOTE — Progress Notes (Signed)
MFM Consultation  Tiffany Floyd is a 31 year old G6P2A2 AA female at 16+4 weeks who presents for consultation regarding her prior ~41 and 24 week pregnancy losses.   OB history:  G1: EAB without complication  G2: 2006; SVD at 37 weeks; no prenatal complications; female; 5+0  G3: 02/2011: Vaginal bleeding throughout and then began leaking; evaluated at [facility]; oligohydramnios at ~ 16 weeks; dye study was positive; delivered at home; to [facility] for D&E to remove placenta  G4: 02/2012: Presented to [facility]U c/o losing mucous plug; exam: BBOW; then began bleeding; emergency LTCS; findings c/w placental abruption; neonatal death due to IVH  G5: Current pregnancy - no complications  Medical hx: none Surgical hx: D&E; C/S Allergies: none Medications: PNV Social hx: works part time with school food service; denies tob/ETOH/drugs Family hx: adopted  Assessment:  1) IUP at 16+ weeks 2) H/O abruption x 2 - second and third deliveries; pt has no predisposing factors - etiology for abruptions unknown 3) Possible FGR - first delivery 4) S/P LTCS; would like TOL 5) Desires permanent infertility  Recommendations: 1) Agree with 17P starting at 16 weeks x 20 weeks 2) Thrombophilia labs drawn today 3) Anatomy US with CL in 2 weeks; in CLs every 2 weeks until ~ 28 weeks  It was a pleasure meeting Tiffany Floyd. Please let us know if we can be of further assistance to you or to Tiffany Floyd.  (Face-to-face consultation with patient: 30 min)

## 2012-11-09 LAB — LUPUS ANTICOAGULANT PANEL
DRVVT: 34.6 secs (ref <42.9)
Lupus Anticoagulant: NOT DETECTED

## 2012-11-09 LAB — PROTHROMBIN GENE MUTATION

## 2012-11-09 LAB — BETA-2-GLYCOPROTEIN I ABS, IGG/M/A
Beta-2 Glyco I IgG: 0 G Units (ref <20)
Beta-2-Glycoprotein I IgM: 5 M Units (ref <20)

## 2012-11-09 LAB — PROTEIN C, TOTAL: Protein C, Total: 91 % (ref 72–160)

## 2012-11-09 LAB — PROTEIN C ACTIVITY: Protein C Activity: 200 % — ABNORMAL HIGH (ref 75–133)

## 2012-11-10 ENCOUNTER — Ambulatory Visit (INDEPENDENT_AMBULATORY_CARE_PROVIDER_SITE_OTHER): Payer: Self-pay | Admitting: *Deleted

## 2012-11-10 VITALS — BP 122/76 | HR 89 | Temp 97.0°F | Wt 163.1 lb

## 2012-11-10 DIAGNOSIS — O09899 Supervision of other high risk pregnancies, unspecified trimester: Secondary | ICD-10-CM

## 2012-11-10 DIAGNOSIS — O09219 Supervision of pregnancy with history of pre-term labor, unspecified trimester: Secondary | ICD-10-CM

## 2012-11-10 MED ORDER — HYDROXYPROGESTERONE CAPROATE 250 MG/ML IM OIL
250.0000 mg | TOPICAL_OIL | Freq: Once | INTRAMUSCULAR | Status: AC
  Administered 2012-11-10: 250 mg via INTRAMUSCULAR

## 2012-11-12 ENCOUNTER — Encounter: Payer: Self-pay | Admitting: Obstetrics & Gynecology

## 2012-11-17 ENCOUNTER — Ambulatory Visit: Payer: Self-pay

## 2012-11-17 ENCOUNTER — Ambulatory Visit (INDEPENDENT_AMBULATORY_CARE_PROVIDER_SITE_OTHER): Payer: Self-pay | Admitting: Advanced Practice Midwife

## 2012-11-17 VITALS — BP 102/65 | Temp 96.6°F | Wt 163.2 lb

## 2012-11-17 DIAGNOSIS — O09899 Supervision of other high risk pregnancies, unspecified trimester: Secondary | ICD-10-CM | POA: Insufficient documentation

## 2012-11-17 DIAGNOSIS — Z98891 History of uterine scar from previous surgery: Secondary | ICD-10-CM

## 2012-11-17 DIAGNOSIS — O34219 Maternal care for unspecified type scar from previous cesarean delivery: Secondary | ICD-10-CM

## 2012-11-17 DIAGNOSIS — O09219 Supervision of pregnancy with history of pre-term labor, unspecified trimester: Secondary | ICD-10-CM

## 2012-11-17 LAB — POCT URINALYSIS DIP (DEVICE)
Bilirubin Urine: NEGATIVE
Glucose, UA: NEGATIVE mg/dL
Hgb urine dipstick: NEGATIVE
Ketones, ur: NEGATIVE mg/dL
Specific Gravity, Urine: 1.02 (ref 1.005–1.030)
pH: 8.5 — ABNORMAL HIGH (ref 5.0–8.0)

## 2012-11-17 MED ORDER — HYDROXYPROGESTERONE CAPROATE 250 MG/ML IM OIL
250.0000 mg | TOPICAL_OIL | Freq: Once | INTRAMUSCULAR | Status: AC
  Administered 2012-11-17: 250 mg via INTRAMUSCULAR

## 2012-11-17 NOTE — Progress Notes (Signed)
Had MFM consult and thrombophilia W/U drawn. 11/25/12 F/U visit MFM for CL and review labs. On 17-P. MFM Recommends CLs every 2 weeks.

## 2012-11-17 NOTE — Progress Notes (Signed)
P - 69 

## 2012-11-23 ENCOUNTER — Ambulatory Visit (INDEPENDENT_AMBULATORY_CARE_PROVIDER_SITE_OTHER): Payer: Self-pay | Admitting: Obstetrics & Gynecology

## 2012-11-23 VITALS — BP 114/64 | Wt 163.8 lb

## 2012-11-23 DIAGNOSIS — O09219 Supervision of pregnancy with history of pre-term labor, unspecified trimester: Secondary | ICD-10-CM

## 2012-11-23 LAB — POCT URINALYSIS DIP (DEVICE)
Bilirubin Urine: NEGATIVE
Glucose, UA: NEGATIVE mg/dL
Hgb urine dipstick: NEGATIVE
Ketones, ur: NEGATIVE mg/dL
Nitrite: NEGATIVE
Specific Gravity, Urine: 1.015 (ref 1.005–1.030)
pH: 7.5 (ref 5.0–8.0)

## 2012-11-23 MED ORDER — HYDROXYPROGESTERONE CAPROATE 250 MG/ML IM OIL
250.0000 mg | TOPICAL_OIL | Freq: Once | INTRAMUSCULAR | Status: AC
  Administered 2012-11-23: 250 mg via INTRAMUSCULAR

## 2012-11-25 ENCOUNTER — Other Ambulatory Visit (HOSPITAL_COMMUNITY): Payer: Self-pay | Admitting: Maternal and Fetal Medicine

## 2012-11-25 ENCOUNTER — Ambulatory Visit (HOSPITAL_COMMUNITY)
Admission: RE | Admit: 2012-11-25 | Discharge: 2012-11-25 | Disposition: A | Discharge status: Home / Self Care | Payer: Medicaid Other | Source: Ambulatory Visit | Attending: Obstetrics & Gynecology | Admitting: Obstetrics & Gynecology

## 2012-11-25 VITALS — BP 109/63 | Wt 167.5 lb

## 2012-11-25 DIAGNOSIS — Z8751 Personal history of pre-term labor: Secondary | ICD-10-CM | POA: Insufficient documentation

## 2012-11-25 DIAGNOSIS — Z1389 Encounter for screening for other disorder: Secondary | ICD-10-CM | POA: Insufficient documentation

## 2012-11-25 DIAGNOSIS — O09299 Supervision of pregnancy with other poor reproductive or obstetric history, unspecified trimester: Secondary | ICD-10-CM | POA: Insufficient documentation

## 2012-11-25 DIAGNOSIS — O358XX0 Maternal care for other (suspected) fetal abnormality and damage, not applicable or unspecified: Secondary | ICD-10-CM | POA: Insufficient documentation

## 2012-11-25 DIAGNOSIS — O34219 Maternal care for unspecified type scar from previous cesarean delivery: Secondary | ICD-10-CM | POA: Insufficient documentation

## 2012-11-25 DIAGNOSIS — O09899 Supervision of other high risk pregnancies, unspecified trimester: Secondary | ICD-10-CM

## 2012-11-25 DIAGNOSIS — Z363 Encounter for antenatal screening for malformations: Secondary | ICD-10-CM | POA: Insufficient documentation

## 2012-11-25 NOTE — Progress Notes (Signed)
Ms. Maret had an ultrasound appointment today.  Please see [facility]N report for details.  Comments An active singleton fetus is observed.  Biometry is appropriate for gestational age.  Amniotic fluid volume is normal.  Screening survey of the fetal anatomy was performed and no dysmorphic features are detected.  Impression Active singleton fetus Normal anatomic survey H/o PTD Cervical length is 2.5 cm without apparent funneling.  Recommendations 1. Continue 17P injections weekly; 2. Given that the cervical length is at the lower limit of normal, I have scheduled the patient for follow up cervical length by endovaginal scan in 1 week.  Rogelia Boga, MD, MS, FACOG Assistant Professor Section of Maternal-Fetal Medicine [facility]

## 2012-11-27 ENCOUNTER — Encounter: Payer: Self-pay | Admitting: Obstetrics & Gynecology

## 2012-11-27 DIAGNOSIS — O26879 Cervical shortening, unspecified trimester: Secondary | ICD-10-CM | POA: Insufficient documentation

## 2012-11-30 ENCOUNTER — Ambulatory Visit (INDEPENDENT_AMBULATORY_CARE_PROVIDER_SITE_OTHER): Payer: Medicaid Other | Admitting: *Deleted

## 2012-11-30 VITALS — BP 125/72 | HR 72 | Wt 165.0 lb

## 2012-11-30 DIAGNOSIS — O09899 Supervision of other high risk pregnancies, unspecified trimester: Secondary | ICD-10-CM

## 2012-11-30 DIAGNOSIS — O09219 Supervision of pregnancy with history of pre-term labor, unspecified trimester: Secondary | ICD-10-CM

## 2012-11-30 MED ORDER — HYDROXYPROGESTERONE CAPROATE 250 MG/ML IM OIL
250.0000 mg | TOPICAL_OIL | Freq: Once | INTRAMUSCULAR | Status: AC
  Administered 2012-11-30: 250 mg via INTRAMUSCULAR

## 2012-12-02 ENCOUNTER — Encounter (HOSPITAL_COMMUNITY)
Admission: RE | Disposition: A | Discharge status: Home / Self Care | Payer: Self-pay | Source: Ambulatory Visit | Attending: Obstetrics & Gynecology

## 2012-12-02 ENCOUNTER — Encounter (HOSPITAL_COMMUNITY): Payer: Self-pay | Admitting: Anesthesiology

## 2012-12-02 ENCOUNTER — Encounter (HOSPITAL_COMMUNITY): Payer: Self-pay

## 2012-12-02 ENCOUNTER — Ambulatory Visit (HOSPITAL_COMMUNITY): Payer: Medicaid Other | Admitting: Anesthesiology

## 2012-12-02 ENCOUNTER — Ambulatory Visit (HOSPITAL_COMMUNITY)
Admission: RE | Admit: 2012-12-02 | Discharge: 2012-12-02 | Disposition: A | Discharge status: Home / Self Care | Payer: Medicaid Other | Source: Ambulatory Visit | Attending: Obstetrics & Gynecology | Admitting: Obstetrics & Gynecology

## 2012-12-02 DIAGNOSIS — Z8751 Personal history of pre-term labor: Secondary | ICD-10-CM | POA: Insufficient documentation

## 2012-12-02 DIAGNOSIS — O3430 Maternal care for cervical incompetence, unspecified trimester: Secondary | ICD-10-CM

## 2012-12-02 DIAGNOSIS — O09299 Supervision of pregnancy with other poor reproductive or obstetric history, unspecified trimester: Secondary | ICD-10-CM | POA: Insufficient documentation

## 2012-12-02 DIAGNOSIS — O34219 Maternal care for unspecified type scar from previous cesarean delivery: Secondary | ICD-10-CM | POA: Insufficient documentation

## 2012-12-02 DIAGNOSIS — O26879 Cervical shortening, unspecified trimester: Secondary | ICD-10-CM | POA: Insufficient documentation

## 2012-12-02 HISTORY — PX: CERVICAL CERCLAGE: SHX1329

## 2012-12-02 LAB — CBC
Hemoglobin: 11.6 g/dL — ABNORMAL LOW (ref 12.0–15.0)
MCH: 30.3 pg (ref 26.0–34.0)
MCV: 90.6 fL (ref 78.0–100.0)
Platelets: 242 10*3/uL (ref 150–400)
RBC: 3.83 MIL/uL — ABNORMAL LOW (ref 3.87–5.11)
RDW: 13.6 % (ref 11.5–15.5)
WBC: 9 10*3/uL (ref 4.0–10.5)

## 2012-12-02 SURGERY — CERCLAGE, CERVIX, VAGINAL APPROACH
Anesthesia: Spinal | Wound class: Clean Contaminated

## 2012-12-02 MED ORDER — INDOMETHACIN 50 MG RE SUPP
RECTAL | Status: DC | PRN
  Administered 2012-12-02: 100 mg via RECTAL

## 2012-12-02 MED ORDER — INDOMETHACIN 50 MG RE SUPP
100.0000 mg | Freq: Once | RECTAL | Status: DC
  Filled 2012-12-02: qty 2

## 2012-12-02 MED ORDER — KETOROLAC TROMETHAMINE 30 MG/ML IJ SOLN: 15.0000 mg | Freq: Once | INTRAMUSCULAR | Status: DC | PRN

## 2012-12-02 MED ORDER — DOCUSATE SODIUM 100 MG PO CAPS: 100.0000 mg | ORAL_CAPSULE | Freq: Two times a day (BID) | ORAL | Status: DC | PRN

## 2012-12-02 MED ORDER — BUPIVACAINE HCL (PF) 0.5 % IJ SOLN
INTRAMUSCULAR | Status: DC | PRN
  Administered 2012-12-02: 30 mL

## 2012-12-02 MED ORDER — FENTANYL CITRATE 0.05 MG/ML IJ SOLN
INTRAMUSCULAR | Status: DC | PRN
  Administered 2012-12-02: 50 ug via INTRAVENOUS

## 2012-12-02 MED ORDER — INDOMETHACIN 50 MG PO CAPS: 50.0000 mg | ORAL_CAPSULE | Freq: Three times a day (TID) | ORAL | Status: DC

## 2012-12-02 MED ORDER — OXYCODONE-ACETAMINOPHEN 5-325 MG PO TABS: 1.0000 | ORAL_TABLET | Freq: Four times a day (QID) | ORAL | Status: DC | PRN

## 2012-12-02 MED ORDER — LIDOCAINE IN DEXTROSE 5-7.5 % IV SOLN
INTRAVENOUS | Status: DC | PRN
  Administered 2012-12-02: 50 mg via INTRATHECAL

## 2012-12-02 MED ORDER — EPHEDRINE SULFATE 50 MG/ML IJ SOLN
INTRAMUSCULAR | Status: DC | PRN
  Administered 2012-12-02 (×5): 10 mg via INTRAVENOUS

## 2012-12-02 MED ORDER — FENTANYL CITRATE 0.05 MG/ML IJ SOLN: 25.0000 ug | INTRAMUSCULAR | Status: DC | PRN

## 2012-12-02 MED ORDER — ONDANSETRON HCL 4 MG/2ML IJ SOLN
INTRAMUSCULAR | Status: DC | PRN
  Administered 2012-12-02: 4 mg via INTRAVENOUS

## 2012-12-02 MED ORDER — LACTATED RINGERS IV SOLN
INTRAVENOUS | Status: DC
  Administered 2012-12-02: 11:00:00 via INTRAVENOUS
  Administered 2012-12-02: 125 mL/h via INTRAVENOUS

## 2012-12-02 MED ORDER — DEXTROSE 5 % IV SOLN
2.0000 g | INTRAVENOUS | Status: AC
  Administered 2012-12-02: 2 g via INTRAVENOUS
  Filled 2012-12-02: qty 2

## 2012-12-02 SURGICAL SUPPLY — 23 items
CATH ROBINSON RED A/P 16FR (CATHETERS) IMPLANT
CLOTH BEACON ORANGE TIMEOUT ST (SAFETY) ×2 IMPLANT
COUNTER NEEDLE 1200 MAGNETIC (NEEDLE) IMPLANT
ELECT REM PT RETURN 9FT ADLT (ELECTROSURGICAL)
ELECTRODE REM PT RTRN 9FT ADLT (ELECTROSURGICAL) IMPLANT
GAUZE PACKING 1 X5 YD ST (GAUZE/BANDAGES/DRESSINGS) ×4 IMPLANT
GLOVE BIO SURGEON STRL SZ7 (GLOVE) ×2 IMPLANT
GLOVE BIOGEL PI IND STRL 7.0 (GLOVE) ×2 IMPLANT
GLOVE BIOGEL PI INDICATOR 7.0 (GLOVE) ×2
GLOVE SURG SS PI 7.0 STRL IVOR (GLOVE) ×4 IMPLANT
GOWN STRL REIN XL XLG (GOWN DISPOSABLE) ×6 IMPLANT
NEEDLE SPNL 22GX3.5 QUINCKE BK (NEEDLE) IMPLANT
PACK VAGINAL MINOR WOMEN LF (CUSTOM PROCEDURE TRAY) ×2 IMPLANT
PAD OB MATERNITY 4.3X12.25 (PERSONAL CARE ITEMS) ×2 IMPLANT
PAD PREP 24X48 CUFFED NSTRL (MISCELLANEOUS) ×2 IMPLANT
PENCIL BUTTON HOLSTER BLD 10FT (ELECTRODE) IMPLANT
SUT PROLENE 1 CT 1 30 (SUTURE) ×2 IMPLANT
SYR BULB IRRIGATION 50ML (SYRINGE) ×2 IMPLANT
SYR CONTROL 10ML LL (SYRINGE) IMPLANT
TOWEL OR 17X24 6PK STRL BLUE (TOWEL DISPOSABLE) ×4 IMPLANT
TUBING NON-CON 1/4 X 20 CONN (TUBING) IMPLANT
WATER STERILE IRR 1000ML POUR (IV SOLUTION) ×2 IMPLANT
YANKAUER SUCT BULB TIP NO VENT (SUCTIONS) IMPLANT

## 2012-12-02 NOTE — H&P (Signed)
Preoperative History and Physical  Tiffany Floyd is a 31 y.o. [id number] at [redacted]w[redacted]d here for surgical management of shortended cervix found to be 1.6 cm on MFM ultrasound today. Patient denies any contractions, bleeding, LOF. Good FM.  Proposed surgery: Transvaginal McDonald cerclage placement  Past Medical History  Diagnosis Date  . Post traumatic stress disorder (PTSD)   . Explosive personality disorder    Past Surgical History  Procedure Date  . Cesarean section 03/10/2012    Procedure: CESAREAN SECTION;  Surgeon: Tereso Newcomer, MD;  Location: [facility];  Service: Gynecology;  Laterality: N/A;  Primary Cesarean Section Delivery Boy @ 2344,   OB History    Grav Para Term Preterm Abortions TAB SAB Ect Mult Living   5 2 1 1 2 1 1  0 0 1    Patient denies any recent cervical dysplasia or STIs.  Prescriptions prior to admission  Medication Sig Dispense Refill  . hydroxyprogesterone caproate (DELALUTIN) 250 mg/mL OIL Inject 250 mg into the muscle once a week.      . Prenatal Vit-Fe Fumarate-FA (PRENATAL MULTIVITAMIN) TABS Take 1 tablet by mouth daily.        No Known Allergies  Social History:   reports that she has never smoked. She has never used smokeless tobacco. She reports that she does not drink alcohol or use illicit drugs.  Family History: Noncontributory  Review of Systems: Noncontributory  PHYSICAL EXAM: Blood pressure 134/75, pulse 80, temperature 96.8 F (36 C), temperature source Oral, resp. rate 16, height 5\' 3"  (1.6 m), weight 165 lb (74.844 kg), last menstrual period 05/17/2012, SpO2 99.00%. General appearance - alert, well appearing, and in no distress Chest - clear to auscultation, no wheezes, rales or rhonchi, symmetric air entry Heart - normal rate and regular rhythm Abdomen - soft, nontender, nondistended, no masses or organomegaly Pelvic - examination not indicated Extremities - peripheral pulses normal, no pedal edema, no clubbing or  cyanosis  Labs: Recent Results (from the past 336 hour(s))  POCT URINALYSIS DIP (DEVICE)   Collection Time   11/23/12  8:10 AM      Component Value Range   Glucose, UA NEGATIVE  NEGATIVE mg/dL   Bilirubin Urine NEGATIVE  NEGATIVE   Ketones, ur NEGATIVE  NEGATIVE mg/dL   Specific Gravity, Urine 1.015  1.005 - 1.030   Hgb urine dipstick NEGATIVE  NEGATIVE   pH 7.5  5.0 - 8.0   Protein, ur NEGATIVE  NEGATIVE mg/dL   Urobilinogen, UA 1.0  0.0 - 1.0 mg/dL   Nitrite NEGATIVE  NEGATIVE   Leukocytes, UA TRACE (*) NEGATIVE  CBC   Collection Time   12/02/12  9:29 AM      Component Value Range   WBC 9.0  4.0 - 10.5 K/uL   RBC 3.83 (*) 3.87 - 5.11 MIL/uL   Hemoglobin 11.6 (*) 12.0 - 15.0 g/dL   HCT 30.8 (*) 65.7 - 84.6 %   MCV 90.6  78.0 - 100.0 fL   MCH 30.3  26.0 - 34.0 pg   MCHC 33.4  30.0 - 36.0 g/dL   RDW 96.2  95.2 - 84.1 %   Platelets 242  150 - 400 K/uL   Assessment: Patient Active Problem List  Diagnosis  . Dyspnea  . Supervision of other high-risk pregnancy  . Previous cesarean section  . History of preterm delivery, currently pregnant  . Short cervix affecting pregnancy    Plan: Patient will undergo surgical management with transvaginal McDonald cervical cerclage.  The risks of surgery were discussed in detail with the patient including but not limited to: bleeding; infection which may require antibiotic therapy; injury to cervix, vagina other surrounding organs; risk of ruptured membranes and/or preterm delivery and other postoperative or anesthesia complications.  The patient concurred with the proposed plan, giving informed written consent for the surgery.  Patient has been NPO since 1500 yesterday and she will remain NPO for procedure.  Anesthesia and OR aware.  Preoperative prophylactic antibiotics and SCDs ordered on call to the OR.  To OR when ready.  Jaynie Collins, M.D. 12/02/2012 10:04 AM

## 2012-12-02 NOTE — Anesthesia Preprocedure Evaluation (Addendum)
Anesthesia Evaluation  Patient identified by MRN, date of birth, ID band Patient awake    Reviewed: Allergy & Precautions, H&P , NPO status , Patient's Chart, lab work & pertinent test results, reviewed documented beta blocker date and time   History of Anesthesia Complications (+) DIFFICULT AIRWAY  Airway Mallampati: II TM Distance: >3 FB Neck ROM: full    Dental No notable dental hx. (+)    Pulmonary neg pulmonary ROS,  breath sounds clear to auscultation  Pulmonary exam normal       Cardiovascular Exercise Tolerance: Good negative cardio ROS  Rhythm:regular Rate:Normal     Neuro/Psych PSYCHIATRIC DISORDERS (PTSD, personality disorder) negative neurological ROS     GI/Hepatic negative GI ROS, Neg liver ROS,   Endo/Other  negative endocrine ROS  Renal/GU negative Renal ROS  negative genitourinary   Musculoskeletal   Abdominal   Peds  Hematology negative hematology ROS (+)   Anesthesia Other Findings NPO since 3 pm yesterday h/o STAT c/s under GA (unable to intubate with glidescope - did case with LMA  Reproductive/Obstetrics (+) Pregnancy (20 weeks, incompetent cervix, h/o c/s under GA at 24 weeks  (baby did not survive))                        Anesthesia Physical Anesthesia Plan  ASA: II and emergent  Anesthesia Plan: Spinal   Post-op Pain Management:    Induction:   Airway Management Planned:   Additional Equipment:   Intra-op Plan:   Post-operative Plan:   Informed Consent: I have reviewed the patients History and Physical, chart, labs and discussed the procedure including the risks, benefits and alternatives for the proposed anesthesia with the patient or authorized representative who has indicated his/her understanding and acceptance.   Dental Advisory Given  Plan Discussed with: CRNA and Surgeon  Anesthesia Plan Comments: (Lab work confirmed with CRNA in room.  Platelets okay. Discussed spinal anesthetic, and patient consents to the procedure:  included risk of possible headache,backache, failed block, allergic reaction, and nerve injury. This patient was asked if she had any questions or concerns before the procedure started. )       Anesthesia Quick Evaluation

## 2012-12-02 NOTE — Anesthesia Postprocedure Evaluation (Signed)
Anesthesia Post Note  Patient: Tiffany Floyd  Procedure(s) Performed: Procedure(s) (LRB): CERCLAGE CERVICAL (N/A)  Anesthesia type: Spinal  Patient location: PACU  Post pain: Pain level controlled  Post assessment: Post-op Vital signs reviewed  Last Vitals:  Filed Vitals:   12/02/12 1215  BP:   Pulse: 93  Temp:   Resp: 17    Post vital signs: Reviewed  Level of consciousness: awake  Complications: No apparent anesthesia complications

## 2012-12-02 NOTE — Progress Notes (Signed)
Pt attempts to void twice unsuccessful, bladder scan shows a liter in bladder. Dr. Macon Large I&O cath with good results 900cc

## 2012-12-02 NOTE — Progress Notes (Signed)
Tiffany Floyd was seen for ultrasound appointment today.  Please see [facility]-OBGYN report for details.

## 2012-12-02 NOTE — Transfer of Care (Signed)
Immediate Anesthesia Transfer of Care Note  Patient: Tiffany Floyd  Procedure(s) Performed: Procedure(s) (LRB) with comments: CERCLAGE CERVICAL (N/A)  Patient Location: PACU  Anesthesia Type:Spinal  Level of Consciousness: awake, alert  and oriented  Airway & Oxygen Therapy: Patient Spontanous Breathing  Post-op Assessment: Report given to PACU RN and Post -op Vital signs reviewed and stable  Post vital signs: Reviewed and stable  Complications: No apparent anesthesia complications

## 2012-12-02 NOTE — Anesthesia Procedure Notes (Signed)
Spinal  Patient location during procedure: OR Start time: 12/02/2012 10:31 AM Staffing Performed by: anesthesiologist  Preanesthetic Checklist Completed: patient identified, site marked, surgical consent, pre-op evaluation, timeout performed, IV checked, risks and benefits discussed and monitors and equipment checked Spinal Block Patient position: sitting Prep: site prepped and draped and DuraPrep Patient monitoring: heart rate, cardiac monitor, continuous pulse ox and blood pressure Approach: midline Location: L3-4 Injection technique: single-shot Needle Needle type: Sprotte  Needle gauge: 24 G Needle length: 9 cm Assessment Sensory level: T4 Additional Notes Clear free flow CSF on first attempt.  Transient left paresthesia.  Patient tolerated procedure well.  Jasmine December, MD

## 2012-12-02 NOTE — Op Note (Signed)
Tiffany Floyd   PROCEDURE DATE: 12/02/2012  PREOPERATIVE DIAGNOSIS: Intrauterine pregnancy at [redacted]w[redacted]d, shortened cervix to 1.6 cm in length   POSTOPERATIVE DIAGNOSIS: The same PROCEDURE: Transvaginal McDonald Cervical Cerclage Placement SURGEON:  Dr. Jaynie Collins  INDICATIONS: 31 y.o. [id number] at [redacted]w[redacted]d with shortened cervix to 1.6 cm in lengthe, here for cerclage placement.   The risks of surgery were discussed in detail with the patient including but not limited to: bleeding; infection which may require antibiotic therapy; injury to cervix, vagina other surrounding organs; risk of ruptured membranes and/or preterm delivery and other postoperative or anesthesia complications.  Written informed consent was obtained.    FINDINGS:  Less than 1 cm palpable cervical length in the vagina, cervix opened to fingertip width, suture knot placed anteriorly.  ANESTHESIA:  Spinal INTRAVENOUS FLUIDS: 1300  ml ESTIMATED BLOOD LOSS: 150 ml COMPLICATIONS: None immediate  PROCEDURE IN DETAIL:  The patient received intravenous antibiotics and had sequential compression devices applied to her lower extremities while in the preoperative area.  Reassuring fetal heart rate was also obtained using a doppler. She was then taken to the operating room where spinal anesthesia was administered and was found to be adequate.  She was placed in the dorsal lithotomy, and was prepped and draped in a sterile manner. Her bladder was catheterized for an unmeasured amount of clear, yellow urine.  Indomethacin 100 mg rectal suppository was placed.  After an adequate timeout was performed, a vaginal speculum was then placed in the patient's vagina and a single tooth tenaculum was applied to the anterior lip of the cervix.  A paracervical block using 0.5 % Marcaine was administered.  The anterior and posterior lips of the cervix was grasped with ring forceps. A curved needle loaded with a number 1 Prolene suture was inserted at 12 o'clock,  as high as possible at the junction of the rugated vaginal epithelium and the smooth cervix, at least 2 cm above the external os.  Four bites are taken circumferentially around the entire cervix in a purse-string fashion, each bite should be deep enough to extend at least midway into the cervical stroma, but not into the endocervical canal.  The two ends of the suture were then tied securely anteriorly and cut, leaving the ends long enough to grasp with a clamp when it is time to remove it and the ring forceps were removed. All instruments were removed from the patient's vagina.  Instrument, needle and sponge counts were correct x 2.  Of note, there was significant bleeding during the case which made exposure difficult, and at the end, a vaginal packing was placed to help with hemostasis. An ultrasound was done to evaluate for possible rupture, normal amniotic fluid volume noted and normal FHR was noted.  The vaginal packing was removed in the PACU prior to discharge, minimal bleeding noted. The patient tolerated the procedure well, and was taken to the recovery area awake and in stable condition.  The patient will be discharged to home as per PACU criteria.  Routine postoperative instructions given.  She was prescribed Percocet and Colace.  She will follow up in the clinic on 12/08/12 for postoperative evaluation and ongoing prenatal care.

## 2012-12-05 ENCOUNTER — Encounter (HOSPITAL_COMMUNITY): Payer: Self-pay | Admitting: Obstetrics & Gynecology

## 2012-12-07 ENCOUNTER — Other Ambulatory Visit: Payer: Self-pay | Admitting: Obstetrics & Gynecology

## 2012-12-07 DIAGNOSIS — O3430 Maternal care for cervical incompetence, unspecified trimester: Secondary | ICD-10-CM

## 2012-12-08 ENCOUNTER — Ambulatory Visit (INDEPENDENT_AMBULATORY_CARE_PROVIDER_SITE_OTHER): Payer: Medicaid Other | Admitting: *Deleted

## 2012-12-08 VITALS — BP 124/64 | HR 74 | Wt 164.9 lb

## 2012-12-08 DIAGNOSIS — O09219 Supervision of pregnancy with history of pre-term labor, unspecified trimester: Secondary | ICD-10-CM

## 2012-12-08 MED ORDER — HYDROXYPROGESTERONE CAPROATE 250 MG/ML IM OIL
250.0000 mg | TOPICAL_OIL | Freq: Once | INTRAMUSCULAR | Status: AC
  Administered 2012-12-08: 250 mg via INTRAMUSCULAR

## 2012-12-09 ENCOUNTER — Ambulatory Visit (HOSPITAL_COMMUNITY)
Admit: 2012-12-09 | Discharge: 2012-12-09 | Disposition: A | Discharge status: Home / Self Care | Payer: Medicaid Other | Attending: Obstetrics & Gynecology | Admitting: Obstetrics & Gynecology

## 2012-12-09 VITALS — BP 115/66 | HR 85 | Wt 167.0 lb

## 2012-12-09 DIAGNOSIS — O26879 Cervical shortening, unspecified trimester: Secondary | ICD-10-CM | POA: Insufficient documentation

## 2012-12-09 DIAGNOSIS — Z8751 Personal history of pre-term labor: Secondary | ICD-10-CM | POA: Insufficient documentation

## 2012-12-09 DIAGNOSIS — O34219 Maternal care for unspecified type scar from previous cesarean delivery: Secondary | ICD-10-CM | POA: Insufficient documentation

## 2012-12-09 DIAGNOSIS — O3410 Maternal care for benign tumor of corpus uteri, unspecified trimester: Secondary | ICD-10-CM | POA: Insufficient documentation

## 2012-12-09 DIAGNOSIS — O09299 Supervision of pregnancy with other poor reproductive or obstetric history, unspecified trimester: Secondary | ICD-10-CM | POA: Insufficient documentation

## 2012-12-09 DIAGNOSIS — O3430 Maternal care for cervical incompetence, unspecified trimester: Secondary | ICD-10-CM

## 2012-12-09 NOTE — Progress Notes (Signed)
Tiffany Floyd was seen for ultrasound appointment today.  Please see [facility] report for details.  Impression Single living intrauterine pregnancy at 21 weeks 0 days. s/p cervical cerclage one week ago. Transvaginal cervical length of 1.4-1.6 cm. Cerclage appears intact.

## 2012-12-15 ENCOUNTER — Ambulatory Visit (INDEPENDENT_AMBULATORY_CARE_PROVIDER_SITE_OTHER): Payer: Self-pay | Admitting: Obstetrics and Gynecology

## 2012-12-15 ENCOUNTER — Encounter: Payer: Self-pay | Admitting: Obstetrics and Gynecology

## 2012-12-15 VITALS — BP 119/70 | Temp 97.3°F | Wt 165.4 lb

## 2012-12-15 DIAGNOSIS — Z302 Encounter for sterilization: Secondary | ICD-10-CM

## 2012-12-15 DIAGNOSIS — Z98891 History of uterine scar from previous surgery: Secondary | ICD-10-CM

## 2012-12-15 DIAGNOSIS — O09219 Supervision of pregnancy with history of pre-term labor, unspecified trimester: Secondary | ICD-10-CM

## 2012-12-15 DIAGNOSIS — Z9889 Other specified postprocedural states: Secondary | ICD-10-CM

## 2012-12-15 DIAGNOSIS — O09899 Supervision of other high risk pregnancies, unspecified trimester: Secondary | ICD-10-CM

## 2012-12-15 DIAGNOSIS — O26879 Cervical shortening, unspecified trimester: Secondary | ICD-10-CM

## 2012-12-15 LAB — POCT URINALYSIS DIP (DEVICE)
Glucose, UA: NEGATIVE mg/dL
Leukocytes, UA: NEGATIVE
Protein, ur: NEGATIVE mg/dL
Specific Gravity, Urine: 1.02 (ref 1.005–1.030)
Urobilinogen, UA: 1 mg/dL (ref 0.0–1.0)
pH: 7 (ref 5.0–8.0)

## 2012-12-15 MED ORDER — HYDROXYPROGESTERONE CAPROATE 250 MG/ML IM OIL
250.0000 mg | TOPICAL_OIL | Freq: Once | INTRAMUSCULAR | Status: AC
  Administered 2012-12-15: 250 mg via INTRAMUSCULAR

## 2012-12-15 NOTE — Progress Notes (Signed)
[id number]  

## 2012-12-15 NOTE — Progress Notes (Signed)
F/U MFM [place] on 12/26. Patient doing well without complaints.

## 2012-12-22 ENCOUNTER — Ambulatory Visit (HOSPITAL_COMMUNITY)
Admission: RE | Admit: 2012-12-22 | Discharge: 2012-12-22 | Disposition: A | Discharge status: Home / Self Care | Payer: Medicaid Other | Source: Ambulatory Visit | Attending: Family Medicine | Admitting: Family Medicine

## 2012-12-22 ENCOUNTER — Ambulatory Visit (INDEPENDENT_AMBULATORY_CARE_PROVIDER_SITE_OTHER): Payer: Self-pay | Admitting: General Practice

## 2012-12-22 VITALS — BP 115/71 | HR 80 | Wt 167.0 lb

## 2012-12-22 VITALS — BP 115/71 | HR 81 | Temp 98.7°F | Ht 64.0 in | Wt 166.1 lb

## 2012-12-22 DIAGNOSIS — Z302 Encounter for sterilization: Secondary | ICD-10-CM

## 2012-12-22 DIAGNOSIS — O26879 Cervical shortening, unspecified trimester: Secondary | ICD-10-CM | POA: Insufficient documentation

## 2012-12-22 DIAGNOSIS — O09299 Supervision of pregnancy with other poor reproductive or obstetric history, unspecified trimester: Secondary | ICD-10-CM | POA: Insufficient documentation

## 2012-12-22 DIAGNOSIS — O3410 Maternal care for benign tumor of corpus uteri, unspecified trimester: Secondary | ICD-10-CM | POA: Insufficient documentation

## 2012-12-22 DIAGNOSIS — O09219 Supervision of pregnancy with history of pre-term labor, unspecified trimester: Secondary | ICD-10-CM

## 2012-12-22 DIAGNOSIS — O3430 Maternal care for cervical incompetence, unspecified trimester: Secondary | ICD-10-CM

## 2012-12-22 DIAGNOSIS — O34219 Maternal care for unspecified type scar from previous cesarean delivery: Secondary | ICD-10-CM | POA: Insufficient documentation

## 2012-12-22 DIAGNOSIS — Z8751 Personal history of pre-term labor: Secondary | ICD-10-CM | POA: Insufficient documentation

## 2012-12-22 MED ORDER — HYDROXYPROGESTERONE CAPROATE 250 MG/ML IM OIL
250.0000 mg | TOPICAL_OIL | Freq: Once | INTRAMUSCULAR | Status: AC
  Administered 2012-12-22: 250 mg via INTRAMUSCULAR

## 2012-12-22 NOTE — Addendum Note (Signed)
Encounter addended by: Ty Hilts, RN on: 12/22/2012  4:24 PM<BR>     Documentation filed: Charges VN

## 2012-12-22 NOTE — Progress Notes (Signed)
Tiffany Floyd  was seen today for an ultrasound appointment.  See full report in [facility].  Impression: Single IUP at 22 6/7 weeks Cervical insufficiency s/p Cerclage - for cervical length only Cervical length 0.9 cm with V-shaped funneling to the level of the cerclage stitch  Recommendations: Continue weekly 17-P injections Recommend beginning modified bedrest at home (letter given) Follow up in 1 week for cervical length and growth; plan betamethasone series after that visit.  Alpha Gula, MD

## 2012-12-28 NOTE — L&D Delivery Note (Signed)
Delivery Note Progressed throughout the day to complete dilation.  At 1:58 PM a viable and healthy female was delivered via Vaginal, Spontaneous Delivery (Presentation: Left Occiput Anterior).   APGAR: 3, 8; weight 3 lb 9.5 oz (1630 g).   Placenta status: , Spontaneous.  Cord: 3 vessels with the following complications: None.  Cord pH: pending.   Anesthesia: Epidural  Episiotomy: None Lacerations: None Suture Repair: n/a Est. Blood Loss (mL): 200   Mom to postpartum.  Baby to NICU.  [facility] 02/20/2013, 2:33 PM

## 2012-12-29 ENCOUNTER — Ambulatory Visit (INDEPENDENT_AMBULATORY_CARE_PROVIDER_SITE_OTHER): Payer: Self-pay | Admitting: *Deleted

## 2012-12-29 VITALS — BP 121/77 | HR 80 | Wt 166.7 lb

## 2012-12-29 DIAGNOSIS — O09219 Supervision of pregnancy with history of pre-term labor, unspecified trimester: Secondary | ICD-10-CM

## 2012-12-29 MED ORDER — HYDROXYPROGESTERONE CAPROATE 250 MG/ML IM OIL
250.0000 mg | TOPICAL_OIL | Freq: Once | INTRAMUSCULAR | Status: AC
  Administered 2012-12-29: 250 mg via INTRAMUSCULAR

## 2012-12-30 ENCOUNTER — Encounter (HOSPITAL_COMMUNITY): Payer: Self-pay

## 2012-12-30 ENCOUNTER — Ambulatory Visit (HOSPITAL_COMMUNITY)
Admission: RE | Admit: 2012-12-30 | Discharge: 2012-12-30 | Disposition: A | Discharge status: Home / Self Care | Payer: Medicaid Other | Source: Ambulatory Visit | Attending: Obstetrics & Gynecology | Admitting: Obstetrics & Gynecology

## 2012-12-30 VITALS — BP 115/65 | HR 66 | Wt 168.8 lb

## 2012-12-30 DIAGNOSIS — O3410 Maternal care for benign tumor of corpus uteri, unspecified trimester: Secondary | ICD-10-CM | POA: Insufficient documentation

## 2012-12-30 DIAGNOSIS — Z302 Encounter for sterilization: Secondary | ICD-10-CM

## 2012-12-30 DIAGNOSIS — O09299 Supervision of pregnancy with other poor reproductive or obstetric history, unspecified trimester: Secondary | ICD-10-CM | POA: Insufficient documentation

## 2012-12-30 DIAGNOSIS — O34219 Maternal care for unspecified type scar from previous cesarean delivery: Secondary | ICD-10-CM | POA: Insufficient documentation

## 2012-12-30 DIAGNOSIS — O26879 Cervical shortening, unspecified trimester: Secondary | ICD-10-CM

## 2012-12-30 DIAGNOSIS — O3430 Maternal care for cervical incompetence, unspecified trimester: Secondary | ICD-10-CM

## 2012-12-30 DIAGNOSIS — Z8751 Personal history of pre-term labor: Secondary | ICD-10-CM | POA: Insufficient documentation

## 2012-12-30 MED ORDER — BETAMETHASONE SOD PHOS & ACET 6 (3-3) MG/ML IJ SUSP
12.0000 mg | Freq: Once | INTRAMUSCULAR | Status: AC
  Administered 2012-12-30: 12 mg via INTRAMUSCULAR
  Filled 2012-12-30: qty 2

## 2012-12-30 NOTE — Progress Notes (Signed)
Tiffany Floyd  was seen today for an ultrasound appointment.  See full report in [facility].  Impression: Single IUP at 24 0/7 weeks Cervical insufficiency s/p Cerclage Interval growth is appropriate (36th %tile) Normal interval anatomy  Cervical length 0.9 cm with V-shaped funneling to the level of the cerclage stitch- unchanged from previous exam  Recommendations: Recommend continued modified bedrest at home and weekly 17-P injections Betamethasone series - first dose given today Follow up cervical length next week; if futher cervical shortening noted, would recommend inpatient admission.  Alpha Gula, MD

## 2012-12-30 NOTE — ED Notes (Signed)
Pt given first course of BMZ today.  Will need to go to MAU tomorrow for 2nd dose. Order signed and held.

## 2012-12-31 ENCOUNTER — Inpatient Hospital Stay (HOSPITAL_COMMUNITY)
Admission: AD | Admit: 2012-12-31 | Discharge: 2012-12-31 | Disposition: A | Discharge status: Home / Self Care | Payer: Medicaid Other | Source: Ambulatory Visit | Attending: Obstetrics & Gynecology | Admitting: Obstetrics & Gynecology

## 2012-12-31 DIAGNOSIS — O4700 False labor before 37 completed weeks of gestation, unspecified trimester: Secondary | ICD-10-CM | POA: Insufficient documentation

## 2012-12-31 MED ORDER — BETAMETHASONE SOD PHOS & ACET 6 (3-3) MG/ML IJ SUSP
12.0000 mg | Freq: Once | INTRAMUSCULAR | Status: AC
  Administered 2012-12-31: 12 mg via INTRAMUSCULAR
  Filled 2012-12-31: qty 2

## 2012-12-31 NOTE — MAU Note (Signed)
Pt here for betamethasone injection #2

## 2013-01-05 ENCOUNTER — Encounter (HOSPITAL_COMMUNITY): Payer: Self-pay | Admitting: *Deleted

## 2013-01-05 ENCOUNTER — Inpatient Hospital Stay (HOSPITAL_COMMUNITY)
Admission: AD | Admit: 2013-01-05 | Discharge: 2013-01-27 | DRG: 781 | Disposition: A | Discharge status: Home / Self Care | Payer: Medicaid Other | Source: Ambulatory Visit | Attending: Obstetrics & Gynecology | Admitting: Obstetrics & Gynecology

## 2013-01-05 ENCOUNTER — Encounter: Payer: Self-pay | Admitting: Obstetrics & Gynecology

## 2013-01-05 ENCOUNTER — Ambulatory Visit (HOSPITAL_COMMUNITY)
Admission: RE | Admit: 2013-01-05 | Discharge: 2013-01-05 | Disposition: A | Discharge status: Home / Self Care | Payer: Medicaid Other | Source: Ambulatory Visit | Attending: Obstetrics & Gynecology | Admitting: Obstetrics & Gynecology

## 2013-01-05 ENCOUNTER — Ambulatory Visit (INDEPENDENT_AMBULATORY_CARE_PROVIDER_SITE_OTHER): Payer: Self-pay | Admitting: *Deleted

## 2013-01-05 VITALS — BP 112/70 | HR 86 | Wt 167.5 lb

## 2013-01-05 VITALS — BP 112/69 | HR 81 | Wt 169.0 lb

## 2013-01-05 DIAGNOSIS — Z302 Encounter for sterilization: Secondary | ICD-10-CM

## 2013-01-05 DIAGNOSIS — O09299 Supervision of pregnancy with other poor reproductive or obstetric history, unspecified trimester: Secondary | ICD-10-CM | POA: Insufficient documentation

## 2013-01-05 DIAGNOSIS — O3430 Maternal care for cervical incompetence, unspecified trimester: Principal | ICD-10-CM | POA: Diagnosis present

## 2013-01-05 DIAGNOSIS — Z8751 Personal history of pre-term labor: Secondary | ICD-10-CM | POA: Insufficient documentation

## 2013-01-05 DIAGNOSIS — R0609 Other forms of dyspnea: Secondary | ICD-10-CM | POA: Diagnosis present

## 2013-01-05 DIAGNOSIS — O34219 Maternal care for unspecified type scar from previous cesarean delivery: Secondary | ICD-10-CM | POA: Insufficient documentation

## 2013-01-05 DIAGNOSIS — O26879 Cervical shortening, unspecified trimester: Secondary | ICD-10-CM | POA: Diagnosis present

## 2013-01-05 DIAGNOSIS — O09219 Supervision of pregnancy with history of pre-term labor, unspecified trimester: Secondary | ICD-10-CM

## 2013-01-05 DIAGNOSIS — R0989 Other specified symptoms and signs involving the circulatory and respiratory systems: Secondary | ICD-10-CM | POA: Diagnosis present

## 2013-01-05 DIAGNOSIS — O99891 Other specified diseases and conditions complicating pregnancy: Secondary | ICD-10-CM | POA: Diagnosis present

## 2013-01-05 DIAGNOSIS — O3410 Maternal care for benign tumor of corpus uteri, unspecified trimester: Secondary | ICD-10-CM

## 2013-01-05 DIAGNOSIS — O09899 Supervision of other high risk pregnancies, unspecified trimester: Secondary | ICD-10-CM

## 2013-01-05 DIAGNOSIS — O4700 False labor before 37 completed weeks of gestation, unspecified trimester: Secondary | ICD-10-CM | POA: Diagnosis present

## 2013-01-05 DIAGNOSIS — Z98891 History of uterine scar from previous surgery: Secondary | ICD-10-CM

## 2013-01-05 LAB — CBC
Hemoglobin: 11.8 g/dL — ABNORMAL LOW (ref 12.0–15.0)
MCH: 30.5 pg (ref 26.0–34.0)
MCV: 92 fL (ref 78.0–100.0)
RBC: 3.87 MIL/uL (ref 3.87–5.11)

## 2013-01-05 LAB — TYPE AND SCREEN

## 2013-01-05 MED ORDER — PRENATAL MULTIVITAMIN CH
1.0000 | ORAL_TABLET | Freq: Every day | ORAL | Status: DC
  Administered 2013-01-05 – 2013-01-27 (×23): 1 via ORAL
  Filled 2013-01-05 (×23): qty 1

## 2013-01-05 MED ORDER — CALCIUM CARBONATE ANTACID 500 MG PO CHEW
2.0000 | CHEWABLE_TABLET | ORAL | Status: DC | PRN
  Administered 2013-01-06: 400 mg via ORAL
  Filled 2013-01-05: qty 2

## 2013-01-05 MED ORDER — ZOLPIDEM TARTRATE 5 MG PO TABS: 5.0000 mg | ORAL_TABLET | Freq: Every evening | ORAL | Status: DC | PRN

## 2013-01-05 MED ORDER — DOCUSATE SODIUM 100 MG PO CAPS
100.0000 mg | ORAL_CAPSULE | Freq: Every day | ORAL | Status: DC
  Administered 2013-01-06 – 2013-01-27 (×22): 100 mg via ORAL
  Filled 2013-01-05 (×23): qty 1

## 2013-01-05 MED ORDER — SODIUM CHLORIDE 0.9 % IJ SOLN: 3.0000 mL | INTRAMUSCULAR | Status: DC | PRN

## 2013-01-05 MED ORDER — SODIUM CHLORIDE 0.9 % IJ SOLN
3.0000 mL | Freq: Two times a day (BID) | INTRAMUSCULAR | Status: DC
  Administered 2013-01-05 – 2013-01-08 (×7): 3 mL via INTRAVENOUS

## 2013-01-05 MED ORDER — HYDROXYPROGESTERONE CAPROATE 250 MG/ML IM OIL
250.0000 mg | TOPICAL_OIL | Freq: Once | INTRAMUSCULAR | Status: AC
  Administered 2013-01-05: 250 mg via INTRAMUSCULAR

## 2013-01-05 MED ORDER — SODIUM CHLORIDE 0.9 % IV SOLN: 250.0000 mL | INTRAVENOUS | Status: DC | PRN

## 2013-01-05 MED ORDER — HYDROXYPROGESTERONE CAPROATE 250 MG/ML IM OIL
250.0000 mg | TOPICAL_OIL | INTRAMUSCULAR | Status: DC
  Administered 2013-01-12 – 2013-01-26 (×3): 250 mg via INTRAMUSCULAR
  Filled 2013-01-05 (×4): qty 1

## 2013-01-05 MED ORDER — ACETAMINOPHEN 325 MG PO TABS
650.0000 mg | ORAL_TABLET | ORAL | Status: DC | PRN
  Administered 2013-01-15 – 2013-01-21 (×4): 650 mg via ORAL
  Filled 2013-01-05 (×4): qty 2

## 2013-01-05 NOTE — Progress Notes (Signed)
Pt has agreed to consider a flu shot but wants to think about it before agreeing to take the shot. Information sheet given to pt

## 2013-01-05 NOTE — H&P (Signed)
Tiffany Floyd is a 32 y.o. female [phone] at [redacted]w[redacted]d presenting for incompetent cervix. No current problems other than incompetent cervix, good fetal movement, no contractions, no bleeding, no leakage of fluid.  Pt has hx of Emergent Primary Low Transverse Cesarean Section in March 2013 for abruption and footling breech.  This child is not living.  She has one living child, her 57 year old daughter, born by vaginal delivery.  Maternal Medical History:  Reason for admission: Reason for Admission:   nauseaIncompetent cervix    Fetal activity: Perceived fetal activity is normal.   Last perceived fetal movement was within the past hour.    Prenatal complications: No bleeding, hypertension or substance abuse.     OB History    Grav Para Term Preterm Abortions TAB SAB Ect Mult Living   5 2 1 1 2 1 1  0 0 1     Past Medical History  Diagnosis Date  . Post traumatic stress disorder (PTSD)   . Explosive personality disorder    Past Surgical History  Procedure Date  . Cesarean section 03/10/2012    Procedure: CESAREAN SECTION;  Surgeon: Tiffany Newcomer, MD;  Location: [facility];  Service: Gynecology;  Laterality: N/A;  Primary Cesarean Section Delivery Boy @ 2344,  . Cervical cerclage 12/02/2012    Procedure: CERCLAGE CERVICAL;  Surgeon: Tiffany Newcomer, MD;  Location: [facility];  Service: Gynecology;  Laterality: N/A;   Family History: unknown (adopted) Social History:  reports that she has never smoked. She has never used smokeless tobacco. She reports that she does not drink alcohol or use illicit drugs.   Prenatal Transfer Tool  Maternal Diabetes: No Genetic Screening: Normal Maternal Ultrasounds/Referrals: Cervix 0.4 cm 01/05/13 Fetal Ultrasounds or other Referrals:  Other: normal anatomy scan Maternal Substance Abuse:  No Significant Maternal Medications:  None Significant Maternal Lab Results:  None Other Comments:  None  Review of Systems  Constitutional: Negative for fever and  chills.  HENT: Negative for congestion.   Eyes: Negative for blurred vision and double vision.  Respiratory: Negative for cough and shortness of breath.   Cardiovascular: Negative for chest pain and palpitations.  Gastrointestinal: Negative for heartburn, nausea, vomiting, abdominal pain, diarrhea and constipation.  Genitourinary: Negative for dysuria.  Skin: Negative for itching and rash.  Neurological: Negative for dizziness, weakness and headaches.      Blood pressure 122/58, pulse 94, temperature 98.5 F (36.9 C), temperature source Oral, resp. rate 20, height 5\' 4"  (1.626 m), weight 75.751 kg (167 lb), last menstrual period 05/17/2012. Maternal Exam:  Uterine Assessment: None   Abdomen: Patient reports no abdominal tenderness. Surgical scars: low transverse.     Fetal Exam Fetal Monitor Review: Mode: ultrasound.   Baseline rate: 145.  Variability: moderate (6-25 bpm).   Pattern: accelerations present and no decelerations.   Daily NST  Fetal State Assessment: Category I - tracings are normal.     Physical Exam  Constitutional: She is oriented to person, place, and time. She appears well-developed and well-nourished.  HENT:  Head: Normocephalic.  Eyes: Conjunctivae normal and EOM are normal. Pupils are equal, round, and reactive to light.  Neck: Normal range of motion. Neck supple. No tracheal deviation present. No thyromegaly present.  Cardiovascular: Normal rate, regular rhythm, normal heart sounds and intact distal pulses.   Respiratory: Effort normal and breath sounds normal.  GI: Soft.       gravid  Musculoskeletal: Normal range of motion. She exhibits no edema and no tenderness.  Neurological: She is alert and oriented to person, place, and time. No cranial nerve deficit.  Skin: Skin is warm and dry.    Prenatal labs: ABO, Rh: --/--/A POS (01/09 4098) Antibody: NEG (01/09 0950) Rubella: Immune (10/02 0000) RPR: Nonreactive (10/02 0000)  HBsAg: Negative  (10/02 0000)  HIV: Non-reactive (10/02 0000)  GBS: Unknown (03/14 0000)  OGTT 1hr 103  Assessment/Plan: 31 y.J.[id number] [redacted]w[redacted]d presenting due to incompetent cervix - Admit to antipartum - s/p cerclage 12/02/12 - bedrest until 28 weeks - daily NST - S/p betamethasone x2   H&P performed with Tiffany Floyd, Tiffany Floyd 01/05/2013, 8:51 PM    I have seen this patient and agree with the above resident's note.  Tiffany Floyd, Tiffany Floyd Certified Nurse-Midwife

## 2013-01-05 NOTE — Progress Notes (Signed)
[facility] ultrasound  Indication: 32 yr old [id number] at [redacted]w[redacted]d with cervical insufficiency s/p ultrasound indicated cerclage this pregnancy with continued cervical shortening for cervical length.  Findings: 1. Single intrauterine pregnancy. 2. Posterior placenta without evidence of previa. 3. Normal amniotic fluid volume. 4. The transvaginal cervical length is shortened to 4mm. The cervix is dilated with funneling past the cerclage. The cerclage is visualized.  Recommendations: 1. Cervical shortening: - previously counseled - reiterated significantly increased risk of PPROM, preterm labor, preterm delivery - recommend hospitalization until at least 28 weeks; recommend reevaluate cervical length at that time and if stable and patient asymptomatic may be managed as an outpatient on modified bedrest if clinically appropriate at that time - recommend modified bedrest with bathroom and shower privileges - recommend NICU consult - recommend monitoring TID or more frequently as needed - patient received betamethasone on 1/3 and 1/4; recommend repeat if clinical picture worsens or delivery imminent after 2 weeks of previous course; if no clinical change would not administer repeat course unless clinical picture warrants - if delivery imminent recommend magnesium sulfate for neuroprotection - recommend SCDs or TED hose while in bed - fetus currently in cephalic presentation 2. recommend fetal growth and cervical length in 3 weeks 3. Recommend glucola to be done at least one week after betamethasone course  Discussed with Dr. Penne Lash Patient sent to labor and delivery  Eulis Foster, MD

## 2013-01-06 ENCOUNTER — Encounter: Payer: Self-pay | Admitting: *Deleted

## 2013-01-06 DIAGNOSIS — O09899 Supervision of other high risk pregnancies, unspecified trimester: Secondary | ICD-10-CM

## 2013-01-06 NOTE — Progress Notes (Signed)
UR completed 

## 2013-01-06 NOTE — Progress Notes (Signed)
Patient ID: Tiffany Floyd, female   DOB: 07-08-1981, 32 y.o.   MRN: [id number] FACULTY PRACTICE ANTEPARTUM(COMPREHENSIVE) NOTE  Tiffany Floyd is a 32 y.o. [id number] at [redacted]w[redacted]d by early ultrasound who is admitted for cervical shortening with cerclage inplace.   Fetal presentation is cephalic. Length of Stay:  1  Days  Subjective: No complaints Patient reports the fetal movement as active. Patient reports uterine contraction  activity as none. Patient reports  vaginal bleeding as none. Patient describes fluid per vagina as None.  Vitals:  Blood pressure 105/57, pulse 84, temperature 98.2 F (36.8 C), temperature source Oral, resp. rate 18, height 5\' 4"  (1.626 m), weight 167 lb (75.751 kg), last menstrual period 05/17/2012. Physical Examination:  General appearance - alert, well appearing, and in no distress Heart - normal rate and regular rhythm Abdomen - soft, nontender, nondistended Fundal Height:  size equals dates Cervical Exam: Not evaluated.  Extremities: extremities normal, atraumatic, no cyanosis or edema and Homans sign is negative, no sign of DVT Membranes:intact  Fetal Monitoring:  Baseline: 145 bpm and Variability: Fair (1-6 bpm)  Labs:  Recent Results (from the past 24 hour(s))  TYPE AND SCREEN   Collection Time   01/05/13  9:50 AM      Component Value Range   ABO/RH(D) A POS     Antibody Screen NEG     Sample Expiration 01/08/2013    CBC   Collection Time   01/05/13  9:58 AM      Component Value Range   WBC 16.1 (*) 4.0 - 10.5 K/uL   RBC 3.87  3.87 - 5.11 MIL/uL   Hemoglobin 11.8 (*) 12.0 - 15.0 g/dL   HCT 32.4 (*) 40.1 - 02.7 %   MCV 92.0  78.0 - 100.0 fL   MCH 30.5  26.0 - 34.0 pg   MCHC 33.1  30.0 - 36.0 g/dL   RDW 25.3  66.4 - 40.3 %   Platelets 239  150 - 400 K/uL    Imaging Studies:     Currently EPIC will not allow sonographic studies to automatically populate into notes.  In the meantime, copy and paste results into note or free text.  Medications:   Scheduled    . docusate sodium  100 mg Oral Daily  . hydroxyprogesterone caproate  250 mg Intramuscular Weekly  . prenatal multivitamin  1 tablet Oral Daily  . sodium chloride  3 mL Intravenous Q12H   I have reviewed the patient's current medications.  ASSESSMENT: Patient Active Problem List  Diagnosis  . Dyspnea  . Supervision of other high-risk pregnancy  . Previous cesarean section  . History of preterm delivery, currently pregnant  . Short cervix affecting pregnancy  . Encounter for sterilization    PLAN: Expectant management, 17 P  Tiffany Floyd 01/06/2013,9:41 AM

## 2013-01-07 MED ORDER — SIMETHICONE 80 MG PO CHEW
160.0000 mg | CHEWABLE_TABLET | Freq: Four times a day (QID) | ORAL | Status: DC | PRN
  Administered 2013-01-14: 160 mg via ORAL

## 2013-01-07 NOTE — Progress Notes (Signed)
Patient ID: Eveny Anastas, female   DOB: 23-Aug-1981, 32 y.o.   MRN: [id number] Patient ID: Aaliyah Gavel, female   DOB: [birth year], 32 y.o.   MRN: [id number] FACULTY PRACTICE ANTEPARTUM(COMPREHENSIVE) NOTE  Shalaine Payson is a 32 y.o. [id number] Estimated Date of Delivery: 04/21/13  at [redacted]w[redacted]d by early ultrasound who is admitted for cervical shortening with cerclage inplace.   Fetal presentation is cephalic. Length of Stay:  2  Days  Subjective: No complaints Patient reports the fetal movement as active. Patient reports uterine contraction  activity as none. Patient reports  vaginal bleeding as none. Patient describes fluid per vagina as None.  Vitals:  Blood pressure 112/53, pulse 81, temperature 98.5 F (36.9 C), temperature source Oral, resp. rate 20, height 5\' 4"  (1.626 m), weight 75.751 kg (167 lb), last menstrual period 05/17/2012. Physical Examination:  General appearance - alert, well appearing, and in no distress Heart - normal rate and regular rhythm Abdomen - soft, nontender, nondistended Fundal Height:  size equals dates Cervical Exam: Not evaluated.  Extremities: extremities normal, atraumatic, no cyanosis or edema and Homans sign is negative, no sign of DVT Membranes:intact  Fetal Monitoring:  Baseline: 145 bpm and Variability: Fair (1-6 bpm)  Labs:  No results found for this or any previous visit (from the past 24 hour(s)).  Imaging Studies:     Currently EPIC will not allow sonographic studies to automatically populate into notes.  In the meantime, copy and paste results into note or free text.  Medications:  Scheduled    . docusate sodium  100 mg Oral Daily  . hydroxyprogesterone caproate  250 mg Intramuscular Weekly  . prenatal multivitamin  1 tablet Oral Daily  . sodium chloride  3 mL Intravenous Q12H   I have reviewed the patient's current medications.  ASSESSMENT: Patient Active Problem List  Diagnosis  . Dyspnea  . Supervision of other high-risk  pregnancy  . Previous cesarean section  . History of preterm delivery, currently pregnant  . Short cervix affecting pregnancy  . Encounter for sterilization    PLAN: Expectant management, 17 P  Yurani Fettes H 01/07/2013,7:47 AM

## 2013-01-08 NOTE — Progress Notes (Signed)
Patient ID: Tiffany Floyd, female   DOB: 07-18-1981, 32 y.o.   MRN: [id number] Patient ID: Tiffany Floyd, female   DOB: 1981-02-23, 32 y.o.   MRN: [id number] FACULTY PRACTICE ANTEPARTUM(COMPREHENSIVE) NOTE  Tiffany Floyd is a 32 y.o. [id number] [redacted]w[redacted]d by early ultrasound who is admitted for cervical shortening with cerclage inplace.   Fetal presentation is cephalic. Length of Stay:  3  Days  Subjective: No complaints, no events yesterday and overnight. Patient reports the fetal movement as active. Patient reports uterine contraction  activity as none. Patient reports  vaginal bleeding as none. Patient describes fluid per vagina as None.  Vitals:  Blood pressure 111/52, pulse 86, temperature 98.5 F (36.9 C), temperature source Oral, resp. rate 20, height 5\' 4"  (1.626 m), weight 167 lb (75.751 kg), last menstrual period 05/17/2012. Physical Examination: General appearance - alert, well appearing, and in no distress Heart - normal rate and regular rhythm Abdomen - soft, nontender, nondistended Fundal Height:  size equals dates Cervical Exam: Not evaluated.  Extremities: extremities normal, atraumatic, no cyanosis or edema and Homans sign is negative, no sign of DVT Membranes:intact  Fetal Monitoring:  Baseline: 145 bpm and Variability: Fair (1-6 bpm) No contractions  Labs:  No results found for this or any previous visit (from the past 24 hour(s)).  Medications:  Scheduled    . docusate sodium  100 mg Oral Daily  . hydroxyprogesterone caproate  250 mg Intramuscular Weekly  . prenatal multivitamin  1 tablet Oral Daily  . sodium chloride  3 mL Intravenous Q12H   I have reviewed the patient's current medications.  ASSESSMENT: Patient Active Problem List  Diagnosis  . Dyspnea  . Supervision of other high-risk pregnancy  . Previous cesarean section  . History of preterm delivery, currently pregnant  . Short cervix affecting pregnancy  . Encounter for sterilization     PLAN: Continue observation, 17P Routine antenatal care  ANYANWU,UGONNA A 01/08/2013,6:12 AM

## 2013-01-09 LAB — TYPE AND SCREEN
ABO/RH(D): A POS
Antibody Screen: NEGATIVE

## 2013-01-09 NOTE — Progress Notes (Signed)
Patient ID: Tiffany Floyd, female   DOB: Sep 25, 1981, 32 y.o.   MRN: [id number] FACULTY PRACTICE ANTEPARTUM(COMPREHENSIVE) NOTE  Tiffany Floyd is a 32 y.o. [id number] [redacted]w[redacted]d by early ultrasound who is admitted for cervical shortening with cerclage in place.  Fetal presentation is cephalic.  Length of Stay: 4 Days  Subjective:  No complaints, no events yesterday and overnight.  Patient reports the fetal movement as active.  Patient reports uterine contraction activity as none.  Patient reports vaginal bleeding as none.  Patient describes fluid per vagina as None.  Filed Vitals:   01/08/13 1202 01/08/13 1609 01/08/13 1610 01/08/13 1950  BP: 109/58  109/68 121/71  Pulse: 93  88 97  Temp:  98.9 F (37.2 C)  99.1 F (37.3 C)  TempSrc:  Oral  Oral  Resp:  20  18  Height:      Weight:         Physical Examination:  General appearance - alert, well appearing, and in no distress  Heart - normal rate and regular rhythm  Abdomen - soft, nontender, nondistended  Fundal Height: size equals dates  Cervical Exam: Not evaluated.  Extremities: extremities normal, atraumatic, no cyanosis or edema and Homans sign is negative, no sign of DVT  Membranes:intact  Fetal Monitoring: Baseline: 145 bpm and Variability: Fair (1-6 bpm)  No contractions  Labs:  No results found for this or any previous visit (from the past 24 hour(s)).  Medications: Scheduled   .  docusate sodium  100 mg  Oral  Daily   .  hydroxyprogesterone caproate  250 mg  Intramuscular  Weekly   .  prenatal multivitamin  1 tablet  Oral  Daily   .  sodium chloride  3 mL  Intravenous  Q12H    I have reviewed the patient's current medications.  ASSESSMENT:  Patient Active Problem List   Diagnosis   .  Dyspnea   .  Supervision of other high-risk pregnancy   .  Previous cesarean section   .  History of preterm delivery, currently pregnant   .  Short cervix affecting pregnancy   .  Encounter for sterilization    PLAN:  Continue  observation, 17P  Routine antenatal care   ARNOLD,JAMES 01/09/2013 6:48 AM

## 2013-01-10 NOTE — Progress Notes (Signed)
Patient ID: Tiffany Floyd, female   DOB: [birth year], 32 y.o.   MRN: [id number] FACULTY PRACTICE ANTEPARTUM(COMPREHENSIVE) NOTE  Tiffany Floyd is a 32 y.o. [id number] [redacted]w[redacted]d by early ultrasound who is admitted for cervical shortening with cerclage in place.  Fetal presentation is cephalic.   Length of Stay: 5 days   Subjective:  No complaints, no events yesterday and overnight.  Patient reports the fetal movement as active.  Patient reports uterine contraction activity as none.  Patient reports vaginal bleeding as none.  Patient describes fluid per vagina as None.  Filed Vitals:   01/09/13 1154 01/09/13 1516 01/09/13 1600 01/09/13 2009  BP: 119/58 132/64  125/63  Pulse: 82 104  89  Temp:  97.7 F (36.5 C)  98.4 F (36.9 C)  TempSrc:  Oral  Oral  Resp:   16 18  Height:      Weight:         Physical Examination:  General appearance - alert, well appearing, and in no distress  Heart - normal rate and regular rhythm  Abdomen - soft, nontender, nondistended  Fundal Height: size equals dates  Cervical Exam: Not evaluated.  Extremities: extremities normal, atraumatic, no cyanosis or edema and Homans sign is negative, no sign of DVT  Membranes:intact  Fetal Monitoring: Baseline: 145 bpm and Variability: moderate, + 10 x 10 accelerations, no decelerations. No contractions   Labs:  Results for orders placed during the hospital encounter of 01/05/13 (from the past 48 hour(s))  TYPE AND SCREEN     Status: Normal   Collection Time   01/09/13 11:07 PM      Component Value Range Comment   ABO/RH(D) A POS      Antibody Screen NEG      Sample Expiration 01/12/2013      Medications: Scheduled     . docusate sodium  100 mg Oral Daily  . hydroxyprogesterone caproate  250 mg Intramuscular Weekly  . prenatal multivitamin  1 tablet Oral Daily    I have reviewed the patient's current medications.  ASSESSMENT:  Patient Active Problem List   Diagnosis   .  Dyspnea   .  Supervision of  other high-risk pregnancy   .  Previous cesarean section   .  History of preterm delivery, currently pregnant   .  Short cervix affecting pregnancy   .  Encounter for sterilization    PLAN:  Continue observation, 17P  Routine antenatal care   Jd Mccaster A 01/10/2013 7:22 AM

## 2013-01-10 NOTE — Progress Notes (Signed)
UR completed 

## 2013-01-11 DIAGNOSIS — O4700 False labor before 37 completed weeks of gestation, unspecified trimester: Secondary | ICD-10-CM

## 2013-01-11 DIAGNOSIS — O26879 Cervical shortening, unspecified trimester: Secondary | ICD-10-CM

## 2013-01-11 NOTE — Progress Notes (Signed)
Patient ID: Tiffany Floyd, female   DOB: 1981/09/09, 32 y.o.   MRN: [id number] FACULTY PRACTICE ANTEPARTUM(COMPREHENSIVE) NOTE  Tiffany Floyd is a 32 y.o. [id number] at [redacted]w[redacted]d by best clinical dating who is admitted for Preterm labor.   Fetal presentation is cephalic. Length of Stay:  6  Days  Subjective: Feels well.  No s/sx's of labor. Patient reports the fetal movement as active. Patient reports uterine contraction  activity as none. Patient reports  vaginal bleeding as none. Patient describes fluid per vagina as None.  Vitals:  Blood pressure 122/65, pulse 96, temperature 97.8 F (36.6 C), temperature source Oral, resp. rate 20, height 5\' 4"  (1.626 m), weight 167 lb (75.751 kg), last menstrual period 05/17/2012. Physical Examination:  General appearance - alert, well appearing, and in no distress Abdomen - soft, nontender, nondistended, no masses or organomegaly Fundus is non-tender Fundal Height:  size equals dates Extremities: extremities normal, atraumatic, no cyanosis or edema  Membranes:intact  Fetal Monitoring:  Baseline: 150 bpm, Variability: Good {> 6 bpm), Accelerations: Non-reactive but appropriate for gestational age and Decelerations: Absent   Medications:  Scheduled    . docusate sodium  100 mg Oral Daily  . hydroxyprogesterone caproate  250 mg Intramuscular Weekly  . prenatal multivitamin  1 tablet Oral Daily   I have reviewed the patient's current medications.  ASSESSMENT: Patient Active Problem List  Diagnosis  . Dyspnea  . Supervision of other high-risk pregnancy  . Previous cesarean section  . History of preterm delivery, currently pregnant  . Short cervix affecting pregnancy  . Encounter for sterilization    PLAN: Continue inpt. Monitoring until 28 wks.  Cerclage intact.  Continue 17 P.  Bartholomew Ramesh S 01/11/2013,6:09 AM

## 2013-01-12 ENCOUNTER — Encounter: Payer: Self-pay | Admitting: Family Medicine

## 2013-01-12 LAB — TYPE AND SCREEN
ABO/RH(D): A POS
Antibody Screen: NEGATIVE

## 2013-01-12 NOTE — Progress Notes (Signed)
Pt requesting a quick W/C ride to see her daughter at the outside of the doors of the hospital.  MD giving her the ok for a 15 min ride.

## 2013-01-12 NOTE — Progress Notes (Signed)
Patient ID: Tiffany Floyd, female   DOB: [birth year], 32 y.o.   MRN: [id number] Patient ID: Tiffany Floyd, female   DOB: 21-Oct-1981, 32 y.o.   MRN: [id number] FACULTY PRACTICE ANTEPARTUM(COMPREHENSIVE) NOTE  Tiffany Floyd is a 32 y.o. [id number] at [redacted]w[redacted]d by best clinical dating who is admitted for Preterm labor.   Fetal presentation is cephalic. Length of Stay:  7  Days  Subjective: Feels well.  No s/sx's of labor. Patient reports the fetal movement as active. Patient reports uterine contraction  activity as none. Patient reports  vaginal bleeding as none. Patient describes fluid per vagina as None.  Vitals:  Blood pressure 122/66, pulse 89, temperature 98.5 F (36.9 C), temperature source Oral, resp. rate 20, height 5\' 4"  (1.626 m), weight 75.751 kg (167 lb), last menstrual period 05/17/2012. Physical Examination:  General appearance - alert, well appearing, and in no distress Abdomen - soft, nontender, nondistended, no masses or organomegaly Fundus is non-tender Fundal Height:  size equals dates Extremities: extremities normal, atraumatic, no cyanosis or edema  Membranes:intact  Fetal Monitoring:  Baseline: 150 bpm, Variability: Good {> 6 bpm), Accelerations: Non-reactive but appropriate for gestational age and Decelerations: Absent   Medications:  Scheduled    . docusate sodium  100 mg Oral Daily  . hydroxyprogesterone caproate  250 mg Intramuscular Weekly  . prenatal multivitamin  1 tablet Oral Daily   I have reviewed the patient's current medications.  ASSESSMENT: Patient Active Problem List  Diagnosis  . Dyspnea  . Supervision of other high-risk pregnancy  . Previous cesarean section  . History of preterm delivery, currently pregnant  . Short cervix affecting pregnancy  . Encounter for sterilization    PLAN: Continue inpt. Monitoring until 28 wks.  Cerclage intact.  Continue 17 P.  Tiffany Floyd H 01/12/2013,8:01 AM

## 2013-01-12 NOTE — Progress Notes (Signed)
Antenatal Nutrition Assessment:  Currently  25 6/[redacted] weeks gestation, with short cervix. Height  64" Weight 167 Lbs pre-pregnancy weight 140 Lbs.Pre-pregnancy  BMI 24.1  IBW 120 Lbs  Total weight gain 27 Lbs. Weight gain goals 25-35 Lbs.   Estimated needs: 1650-1850 kcal/day, 71-81  grams protein/day, 2 liters fluid/day  Regular diet tolerated well, appetite good. Snack menu provide to pt. Current diet prescription will provide for increased needs.  No abnormal nutrition related labs  Nutrition Dx: Increased nutrient needs r/t pregnancy and fetal growth requirements aeb [redacted] weeks gestation.  No educational needs assessed at this time.  Elisabeth Cara M.Odis Luster LDN Neonatal Nutrition Support Specialist Pager [phone]

## 2013-01-13 DIAGNOSIS — O09219 Supervision of pregnancy with history of pre-term labor, unspecified trimester: Secondary | ICD-10-CM

## 2013-01-13 DIAGNOSIS — O26879 Cervical shortening, unspecified trimester: Secondary | ICD-10-CM

## 2013-01-13 NOTE — Progress Notes (Signed)
Patient ID: Tiffany Floyd, female   DOB: December 09, 1981, 32 y.o.   MRN: [id number] FACULTY PRACTICE ANTEPARTUM(COMPREHENSIVE) NOTE  Tiffany Floyd is a 32 y.o. [id number] at 26w by best clinical dating who is admitted for Preterm labor.   Fetal presentation is cephalic. Length of Stay:  8  Days  Subjective: Feels well.  No s/sx's of labor. Patient reports the fetal movement as active. Patient reports uterine contraction  activity as none. Patient reports  vaginal bleeding as none. Patient describes fluid per vagina as None.  Vitals:  Blood pressure 126/66, pulse 93, temperature 98.4 F (36.9 C), temperature source Oral, resp. rate 18, height 5\' 4"  (1.626 m), weight 167 lb (75.751 kg), last menstrual period 05/17/2012. Physical Examination:  General appearance - alert, well appearing, and in no distress Abdomen - soft, gravid, nontender Fundus is non-tender Fundal Height:  size equals dates Extremities: extremities normal, atraumatic, no cyanosis or edema  Membranes:intact  Fetal Monitoring:  Baseline: 145 bpm, Variability: Good {> 6 bpm), Accelerations: Non-reactive but appropriate for gestational age and Decelerations: Absent   Medications:  Scheduled    . docusate sodium  100 mg Oral Daily  . hydroxyprogesterone caproate  250 mg Intramuscular Weekly  . prenatal multivitamin  1 tablet Oral Daily   I have reviewed the patient's current medications.  ASSESSMENT: Patient Active Problem List  Diagnosis  . Dyspnea  . Supervision of other high-risk pregnancy  . Previous cesarean section  . History of preterm delivery, currently pregnant  . Short cervix affecting pregnancy  . Encounter for sterilization    PLAN: Maternal/fetal unit stable Continue weekly 17 p Continue current care  Austyn Seier 01/13/2013,7:34 AM

## 2013-01-14 NOTE — H&P (Signed)
Agree with above note.  Woodward Klem H. 01/14/2013 12:05 PM

## 2013-01-14 NOTE — Progress Notes (Signed)
FACULTY PRACTICE ANTEPARTUM(COMPREHENSIVE) NOTE  Tiffany Floyd is a 32 y.o. [phone] at [redacted]w[redacted]d by early ultrasound who is admitted for cervical incompetence with cerclage in place, and cervical shortening without labor. Plan is for inpatient status til 01/27/13.  Growth u/s scheduled for 1/30.14.   Marland Kitchen Length of Stay:  9  Days  Subjective: No complaints  Patient reports the fetal movement as active. Patient reports uterine contraction  activity as none. Patient reports  vaginal bleeding as none. Patient describes fluid per vagina as None.  Vitals:  Blood pressure 132/62, pulse 84, temperature 98 F (36.7 C), temperature source Oral, resp. rate 20, height 5\' 4"  (1.626 m), weight 75.751 kg (167 lb), last menstrual period 05/17/2012. Physical Examination:  General appearance - alert, well appearing, and in no distress Heart - normal rate and regular rhythm Abdomen - soft, nontender, nondistended Fundal Height:  size equals dates Cervical Exam: Not evaluated. and found to be / / and fetal presentation is unsure. Extremities: extremities normal, atraumatic, no cyanosis or edema and Homans sign is negative, no sign of DVT with DTRs 2+ bilaterally Membranes:intact  Fetal Monitoring:  Baseline: 145 bpm  Labs:  No results found for this or any previous visit (from the past 24 hour(s)).  Imaging Studies:    Marland Kitchen  Medications:  Scheduled    . docusate sodium  100 mg Oral Daily  . hydroxyprogesterone caproate  250 mg Intramuscular Weekly  . prenatal multivitamin  1 tablet Oral Daily   I have reviewed the patient's current medications.  ASSESSMENT: Patient Active Problem List  Diagnosis  . Dyspnea  . Supervision of other high-risk pregnancy  . Previous cesarean section  . History of preterm delivery, currently pregnant  . Short cervix affecting pregnancy  . Encounter for sterilization  s/p BMZ on 1/9 &1/10  PLAN: Inpatient status til 01/27/13  Marlin Brys V 01/14/2013,6:56  AM

## 2013-01-15 LAB — TYPE AND SCREEN

## 2013-01-15 LAB — GLUCOSE TOLERANCE, 1 HOUR: Glucose, 1 Hour GTT: 146 mg/dL — ABNORMAL HIGH (ref 70–140)

## 2013-01-15 MED ORDER — OXYCODONE-ACETAMINOPHEN 5-325 MG PO TABS: 1.0000 | ORAL_TABLET | Freq: Once | ORAL | Status: DC

## 2013-01-15 NOTE — Progress Notes (Signed)
Patient ID: Tiffany Floyd, female   DOB: [birth year], 32 y.o.   MRN: [id number] FACULTY PRACTICE ANTEPARTUM(COMPREHENSIVE) NOTE  Tiffany Floyd is a 32 y.o. [id number] at [redacted]w[redacted]d  who is admitted for advance cervical dilation and incompetent cervix.   Length of Stay:  10  Days  Subjective:   Patient reports the fetal movement as active. Patient reports uterine contraction  activity as none. Patient reports  vaginal bleeding as none. Patient describes fluid per vagina as None.  Vitals:  Blood pressure 123/59, pulse 87, temperature 98.5 F (36.9 C), temperature source Oral, resp. rate 20, height 5\' 4"  (1.626 m), weight 167 lb (75.751 kg), last menstrual period 05/17/2012. Physical Examination:  General appearance - alert, well appearing, and in no distress Abdomen - soft, nontender, gravid Extremities - no edema, redness or tenderness in the calves or thighs, Homan's sign negative bilaterally  Fetal Monitoring:  Baseline: 140 bpm, Variability: Good {> 6 bpm), Accelerations: 1 15x15 accel and many 10x10s. and Decelerations: Absent   Medications:  Scheduled    . docusate sodium  100 mg Oral Daily  . hydroxyprogesterone caproate  250 mg Intramuscular Weekly  . prenatal multivitamin  1 tablet Oral Daily   I have reviewed the patient's current medications.  ASSESSMENT: Patient Active Problem List  Diagnosis  . Dyspnea  . Supervision of other high-risk pregnancy  . Previous cesarean section  . History of preterm delivery, currently pregnant  . Short cervix affecting pregnancy  . Encounter for sterilization    PLAN: Tiffany Floyd is a 32 y.o. [phone] at [redacted]w[redacted]d  who is admitted for advance cervical dilation and incompetent cervix.   1.  Needs a 1 hrr GTT 2.  GBS today 3.  Continue current care.  Tiffany Floyd H. 01/15/2013,7:14 AM

## 2013-01-15 NOTE — Progress Notes (Signed)
1108-glucola given to pt to drink.

## 2013-01-15 NOTE — Progress Notes (Signed)
1610- vaginal/rectal betastrep culture obtained and sent to lab.

## 2013-01-15 NOTE — Progress Notes (Signed)
Dr. Emelda Fear notified of pt's 1 hour gtt of 146.

## 2013-01-16 LAB — GLUCOSE, FASTING GESTATIONAL: Glucose Tolerance, Fasting: 79 mg/dL

## 2013-01-16 LAB — GLUCOSE, 2 HOUR GESTATIONAL: Glucose Tolerance, 2 hour: 126 mg/dL (ref 70–164)

## 2013-01-16 LAB — GLUCOSE, 1 HOUR GESTATIONAL: Glucose Tolerance, 1 hour: 173 mg/dL (ref 70–189)

## 2013-01-16 NOTE — Progress Notes (Signed)
Patient ID: Tiffany Floyd, female   DOB: 1981/03/02, 32 y.o.   MRN: [id number] Abnormal 1 hr GCT 01/15/13 Normal 3 hr GTT 01/16/13  clh-S

## 2013-01-16 NOTE — Progress Notes (Signed)
FACULTY PRACTICE ANTEPARTUM(COMPREHENSIVE) NOTE  Tiffany Floyd is a 32 y.o. [id number] at [redacted]w[redacted]d by criteria , ultrasound   Fetal presentation is cephalic. Length of Stay:  11  Days  Subjective: Patient had abnl 1 hr GTT yesterday, 145, and so today is having 3 hr GTT performed Patient reports the fetal movement as active.  Patient reports uterine contraction activity as none.  Patient reports vaginal bleeding as none.  Vitals:  Blood pressure 130/66, pulse 90, temperature 98.4 F (36.9 C), temperature source Oral, resp. rate 18, height 5\' 4"  (1.626 m), weight 75.751 kg (167 lb), last menstrual period 05/17/2012. Physical Examination:  General appearance - alert, well appearing, and in no distress Heart - normal rate and regular rhythm Abdomen - soft, nontender, nondistended Fundal Height:  size equals dates Cervical Exam: Not evaluated. . Extremities: extremities normal, atraumatic, no cyanosis or edema and Homans sign is negative, no sign of DVT with DTRs 2+ bilaterally Membranes:intact  Fetal Monitoring:  Baseline: 140 bpm, Variability: Good {> 6 bpm), Accelerations: 1 15x15 accel and many 10x10s. and Decelerations: Absent      Labs:  Recent Results (from the past 24 hour(s))  TYPE AND SCREEN   Collection Time   01/15/13  9:45 AM      Component Value Range   ABO/RH(D) A POS     Antibody Screen NEG     Sample Expiration 01/18/2013    GLUCOSE TOLERANCE, 1 HOUR   Collection Time   01/15/13  9:45 AM      Component Value Range   Glucose, 1 Hour GTT 100  70 - 140 mg/dL  GLUCOSE TOLERANCE, 1 HOUR   Collection Time   01/15/13 11:57 AM      Component Value Range   Glucose, 1 Hour GTT 146 (*) 70 - 140 mg/dL  GLUCOSE, FASTING GESTATIONAL   Collection Time   01/16/13  5:30 AM      Component Value Range   Glucose, Fasting-Gestational 79      Imaging Studies:     Currently EPIC will not allow sonographic studies to automatically populate into notes.  In the meantime, copy and  paste results into note or free text.  Medications:  Scheduled    . docusate sodium  100 mg Oral Daily  . hydroxyprogesterone caproate  250 mg Intramuscular Weekly  . oxyCODONE-acetaminophen  1-2 tablet Oral Once  . prenatal multivitamin  1 tablet Oral Daily   I have reviewed the patient's current medications.  ASSESSMENT: Patient Active Problem List  Diagnosis  . Dyspnea  . Supervision of other high-risk pregnancy  . Previous cesarean section  . History of preterm delivery, currently pregnant  . Short cervix affecting pregnancy  . Encounter for sterilization    PLAN: Complete 3 hr GTT,  Hospital stay til 31 Jan, growth u/s  And Cx u/s on 30 th scheduled  Sayf Kerner V 01/16/2013,7:04 AM

## 2013-01-17 DIAGNOSIS — Z9889 Other specified postprocedural states: Secondary | ICD-10-CM

## 2013-01-17 NOTE — Progress Notes (Signed)
Patient ID: Tiffany Floyd, female   DOB: 1981/01/09, 32 y.o.   MRN: [id number] ACULTY PRACTICE ANTEPARTUM COMPREHENSIVE PROGRESS NOTE  Tiffany Floyd is a 32 y.o. [id number] at [redacted]w[redacted]d  who is admitted for Preterm labor, advanced cervical dilation.   Fetal presentation is cephalic.01/05/13 Length of Stay:  12  Days  Subjective: Pt without complaints. Patient reports good fetal movement.  She reports no uterine contractions, no bleeding and no loss of fluid per vagina.  Vitals:  Blood pressure 120/75, pulse 91, temperature 98.2 F (36.8 C), temperature source Oral, resp. rate 18, height 5\' 4"  (1.626 m), weight 167 lb (75.751 kg), last menstrual period 05/17/2012. Physical Examination: General appearance - alert, well appearing, and in no distress Abdomen - soft, nontender, nondistended, no masses or organomegaly Gravid Membranes:intact  Fetal Monitoring:  Baseline: 150 bpm, Variability: Good {> 6 bpm) and Decelerations: occ spontaneous variable decels.  Labs:  Recent Results (from the past 24 hour(s))  GLUCOSE, 2 HOUR GESTATIONAL   Collection Time   01/16/13  7:40 AM      Component Value Range   Glucose, 2 Hour-Gestational 126  70 - 164 mg/dL  GLUCOSE, 3 HOUR GESTATIONAL   Collection Time   01/16/13  8:40 AM      Component Value Range   Glucose, GTT - 3 Hour 118  70 - 144 mg/dL    Imaging Studies:    None pending   Medications:  Scheduled    . docusate sodium  100 mg Oral Daily  . hydroxyprogesterone caproate  250 mg Intramuscular Weekly  . oxyCODONE-acetaminophen  1-2 tablet Oral Once  . prenatal multivitamin  1 tablet Oral Daily   I have reviewed the patient's current medications.  ASSESSMENT: Patient Active Problem List  Diagnosis  . Dyspnea  . Supervision of other high-risk pregnancy  . Previous cesarean section  . History of preterm delivery, currently pregnant  . Short cervix affecting pregnancy  . Encounter for sterilization    PLAN: Continue inpt monitoring  until 28weeks then reassess Continue routine antenatal care. Cont 17 OH-P   HARRAWAY-SMITH, Kiran Lapine 01/17/2013,7:39 AM

## 2013-01-17 NOTE — Plan of Care (Signed)
Problem: Consults Goal: Birthing Suites Patient Information Press F2 to bring up selections list   Pt < [redacted] weeks EGA     

## 2013-01-18 LAB — CULTURE, BETA STREP (GROUP B ONLY)

## 2013-01-18 LAB — TYPE AND SCREEN: ABO/RH(D): A POS

## 2013-01-18 NOTE — Progress Notes (Signed)
Patient ID: Tiffany Floyd, female   DOB: 04/21/1981, 32 y.o.   MRN: [id number] FACULTY PRACTICE ANTEPARTUM COMPREHENSIVE PROGRESS NOTE  Tiffany Floyd is a 32 y.o. [id number] at [redacted]w[redacted]d  who is admitted for preterm labor, cervical shortening with cerclage in place.   Fetal presentation is cephalic.01/05/13 Length of Stay:  13  Days  Subjective: Pt without complaints. Patient reports good fetal movement.  She reports no uterine contractions, no bleeding and no loss of fluid per vagina.  Vitals:  Blood pressure 116/58, pulse 86, temperature 97.5 F (36.4 C), temperature source Oral, resp. rate 18, height 5\' 4"  (1.626 m), weight 167 lb (75.751 kg), last menstrual period 05/17/2012. Physical Examination: General appearance - alert, well appearing, and in no distress Abdomen - soft, gravid, nontender Membranes:intact Extremities: NT, equal in size.   Fetal Monitoring:  Baseline: 150 bpm, Variability: Good {> 6 bpm) and Decelerations: Absent Toco: no contractions  Labs:  No results found for this or any previous visit (from the past 24 hour(s)).  Imaging Studies:    None pending   Medications:  Scheduled    . docusate sodium  100 mg Oral Daily  . hydroxyprogesterone caproate  250 mg Intramuscular Weekly  . oxyCODONE-acetaminophen  1-2 tablet Oral Once  . prenatal multivitamin  1 tablet Oral Daily   I have reviewed the patient's current medications.  ASSESSMENT: Patient Active Problem List  Diagnosis  . Dyspnea  . Supervision of other high-risk pregnancy  . Previous cesarean section  . History of preterm delivery, currently pregnant  . Short cervix affecting pregnancy  . Encounter for sterilization    PLAN: Continue inpt monitoring until 28weeks then reassess Continue routine antenatal care. Cont weekly 17 OH-P   Letisia Schwalb 01/18/2013,7:33 AM

## 2013-01-19 NOTE — Progress Notes (Signed)
Patient ID: Tiffany Floyd, female   DOB: 1981-11-29, 32 y.o.   MRN: [id number] Patient ID: Tiffany Floyd, female   DOB: 1981/07/12, 32 y.o.   MRN: [id number] FACULTY PRACTICE ANTEPARTUM COMPREHENSIVE PROGRESS NOTE  Tiffany Floyd is a 32 y.o. [id number] at [redacted]w[redacted]d  who is admitted for preterm labor, cervical shortening with cerclage in place.   Fetal presentation is cephalic.01/05/13 Length of Stay:  14  Days  Subjective: Pt without complaints. Patient reports good fetal movement.  She reports no uterine contractions, no bleeding and no loss of fluid per vagina.  Vitals:  Blood pressure 117/64, pulse 96, temperature 97.8 F (36.6 C), temperature source Oral, resp. rate 18, height 5\' 4"  (1.626 m), weight 75.751 kg (167 lb), last menstrual period 05/17/2012. Physical Examination: General appearance - alert, well appearing, and in no distress Abdomen - soft, gravid, nontender Membranes:intact Extremities: NT, equal in size.   Fetal Monitoring:  Baseline: 150 bpm, Variability: Good {> 6 bpm) and Decelerations: Absent Toco: no contractions  Labs:  Recent Results (from the past 24 hour(s))  TYPE AND SCREEN   Collection Time   01/18/13  8:00 AM      Component Value Range   ABO/RH(D) A POS     Antibody Screen NEG     Sample Expiration 01/21/2013      Imaging Studies:    None pending   Medications:  Scheduled    . docusate sodium  100 mg Oral Daily  . hydroxyprogesterone caproate  250 mg Intramuscular Weekly  . oxyCODONE-acetaminophen  1-2 tablet Oral Once  . prenatal multivitamin  1 tablet Oral Daily   I have reviewed the patient's current medications.  ASSESSMENT: Patient Active Problem List  Diagnosis  . Dyspnea  . Supervision of other high-risk pregnancy  . Previous cesarean section  . History of preterm delivery, currently pregnant  . Short cervix affecting pregnancy  . Encounter for sterilization    PLAN: Continue inpt monitoring until 28weeks then reassess Continue  routine antenatal care. Cont weekly 17 OH-P   EURE,LUTHER H 01/19/2013,7:55 AM

## 2013-01-19 NOTE — Progress Notes (Signed)
UR completed 

## 2013-01-20 MED ORDER — TETANUS-DIPHTH-ACELL PERTUSSIS 5-2.5-18.5 LF-MCG/0.5 IM SUSP
0.5000 mL | Freq: Once | INTRAMUSCULAR | Status: AC
  Administered 2013-01-20: 0.5 mL via INTRAMUSCULAR
  Filled 2013-01-20: qty 0.5

## 2013-01-20 NOTE — Progress Notes (Signed)
Patient ID: Tiffany Floyd, female   DOB: 1981/03/03, 32 y.o.   MRN: [id number]  Saw patient to offer flu vaccine, tdap and discuss need for routine 28-week labs. Patient refused flu shot.  Napoleon Form, MD

## 2013-01-20 NOTE — Progress Notes (Signed)
Patient ID: Tiffany Floyd, female   DOB: 09-Dec-1981, 32 y.o.   MRN: [id number] FACULTY PRACTICE ANTEPARTUM(COMPREHENSIVE) NOTE  Tiffany Floyd is a 32 y.o. [id number] at [redacted]w[redacted]d by early ultrasound who is admitted for Preterm labor cervical shortening with cerclage Fetal presentation is cephalic.01/05/13 Length of Stay:  15  Days  Subjective: No pain or pressure Patient reports the fetal movement as active. Patient reports uterine contraction  activity as none. Patient reports  vaginal bleeding as none. Patient describes fluid per vagina as None.  Vitals:  Blood pressure 117/78, pulse 101, temperature 98.5 F (36.9 C), temperature source Oral, resp. rate 20, height 5\' 4"  (1.626 m), weight 167 lb (75.751 kg), last menstrual period 05/17/2012. Physical Examination:  General appearance - alert, well appearing, and in no distress Heart - normal rate and regular rhythm Abdomen - soft, nontender, nondistended Fundal Height:  size equals dates Cervical Exam: Not evaluated. and found to be not evaluated/  Extremities: extremities normal, atraumatic, no cyanosis or edema Membranes:intact  Fetal Monitoring:  Baseline: 150 bpm, Variability: Fair (1-6 bpm), Accelerations: Non-reactive but appropriate for gestational age and Decelerations: Absent  Labs:  No results found for this or any previous visit (from the past 24 hour(s)).  Imaging Studies:      Medications:  Scheduled    . docusate sodium  100 mg Oral Daily  . hydroxyprogesterone caproate  250 mg Intramuscular Weekly  . oxyCODONE-acetaminophen  1-2 tablet Oral Once  . prenatal multivitamin  1 tablet Oral Daily   I have reviewed the patient's current medications.  ASSESSMENT: Patient Active Problem List  Diagnosis  . Dyspnea  . Supervision of other high-risk pregnancy  . Previous cesarean section  . History of preterm delivery, currently pregnant  . Short cervix affecting pregnancy  . Encounter for sterilization     PLAN: Continue inpt monitoring until 28weeks then reassess  Continue routine antenatal care.  Cont weekly 17 OH-P      Tiffany Floyd 01/20/2013,6:50 AM

## 2013-01-21 LAB — TYPE AND SCREEN
ABO/RH(D): A POS
Antibody Screen: NEGATIVE

## 2013-01-21 LAB — HIV ANTIBODY (ROUTINE TESTING W REFLEX): HIV: NONREACTIVE

## 2013-01-21 LAB — CBC
HCT: 32.8 % — ABNORMAL LOW (ref 36.0–46.0)
Hemoglobin: 10.8 g/dL — ABNORMAL LOW (ref 12.0–15.0)
MCH: 30.3 pg (ref 26.0–34.0)
MCV: 92.1 fL (ref 78.0–100.0)
Platelets: 193 10*3/uL (ref 150–400)
RBC: 3.56 MIL/uL — ABNORMAL LOW (ref 3.87–5.11)
WBC: 9.8 10*3/uL (ref 4.0–10.5)

## 2013-01-21 MED ORDER — ONDANSETRON HCL 4 MG PO TABS
4.0000 mg | ORAL_TABLET | Freq: Three times a day (TID) | ORAL | Status: DC | PRN
  Administered 2013-01-21: 4 mg via ORAL
  Filled 2013-01-21: qty 1

## 2013-01-21 NOTE — Progress Notes (Signed)
Patient ID: Tiffany Floyd, female   DOB: 01-21-1981, 32 y.o.   MRN: [id number] Tiffany Floyd is a 32 y.o. [id number] at [redacted]w[redacted]d by early ultrasound who is admitted for Preterm labor cervical shortening with cerclage  Fetal presentation is cephalic.01/05/13  Length of Stay: 16 Days  Subjective:  No pain or pressure  Patient reports the fetal movement as active.  Patient reports uterine contraction activity as none.  Patient reports vaginal bleeding as none.  Patient describes fluid per vagina as None.  Vitals: Blood pressure 117/78, pulse 101, temperature 98.5 F (36.9 C), temperature source Oral, resp. rate 20, height 5\' 4"  (1.626 m), weight 167 lb (75.751 kg), last menstrual period 05/17/2012.  Physical Examination:  General appearance - alert, well appearing, and in no distress  Heart - normal rate and regular rhythm  Abdomen - soft, nontender, nondistended  Fundal Height: size equals dates  Cervical Exam: Not evaluated. and found to be not evaluated/  Extremities: extremities normal, atraumatic, no cyanosis or edema  Membranes:intact  Fetal Monitoring: Baseline: 150 bpm, Variability: Fair (1-6 bpm), Accelerations: Non-reactive but appropriate for gestational age and Decelerations: Absent  Labs:  No results found for this or any previous visit (from the past 24 hour(s)).  Imaging Studies:  Medications: Scheduled  .  docusate sodium  100 mg  Oral  Daily   .  hydroxyprogesterone caproate  250 mg  Intramuscular  Weekly   .  oxyCODONE-acetaminophen  1-2 tablet  Oral  Once   .  prenatal multivitamin  1 tablet  Oral  Daily   I have reviewed the patient's current medications.  ASSESSMENT:  Patient Active Problem List   Diagnosis   .  Dyspnea   .  Supervision of other high-risk pregnancy   .  Previous cesarean section   .  History of preterm delivery, currently pregnant   .  Short cervix affecting pregnancy   .  Encounter for sterilization   PLAN:  Continue inpt monitoring until 28weeks  then reassess  Continue routine antenatal care.  Cont weekly 17 OH-P

## 2013-01-22 NOTE — Progress Notes (Signed)
Patient ID: Tiffany Floyd, female   DOB: [birth year], 32 y.o.   MRN: [id number] Charise Leinbach is a 32 y.o. [id number] at [redacted]w[redacted]d by early ultrasound who is admitted for Preterm labor cervical shortening with cerclage  Fetal presentation is cephalic.01/05/13  Length of Stay: 16 Days  Subjective:  No pain or pressure  Patient reports the fetal movement as active.  Patient reports uterine contraction activity as none.  Patient reports vaginal bleeding as none.  Patient describes fluid per vagina as None.  Vitals: Blood pressure 117/78, pulse 101, temperature 98.5 F (36.9 C), temperature source Oral, resp. rate 20, height 5\' 4"  (1.626 m), weight 167 lb (75.751 kg), last menstrual period 05/17/2012.  Physical Examination:  General appearance - alert, well appearing, and in no distress  Heart - normal rate and regular rhythm  Abdomen - soft, nontender, nondistended  Fundal Height: size equals dates  Cervical Exam: Not evaluated. and found to be not evaluated/  Extremities: extremities normal, atraumatic, no cyanosis or edema  Membranes:intact  Fetal Monitoring: Baseline: 150 bpm, Variability: Fair (1-6 bpm), Accelerations: Non-reactive but appropriate for gestational age and Decelerations: Absent  Toco: no contractions Labs:  No results found for this or any previous visit (from the past 24 hour(s)).  Imaging Studies:  Medications: Scheduled  .  docusate sodium  100 mg  Oral  Daily   .  hydroxyprogesterone caproate  250 mg  Intramuscular  Weekly   .  oxyCODONE-acetaminophen  1-2 tablet  Oral  Once   .  prenatal multivitamin  1 tablet  Oral  Daily   I have reviewed the patient's current medications.  ASSESSMENT:  Patient Active Problem List   Diagnosis   .  Dyspnea   .  Supervision of other high-risk pregnancy   .  Previous cesarean section   .  History of preterm delivery, currently pregnant   .  Short cervix affecting pregnancy   .  Encounter for sterilization   PLAN:  Continue inpt  monitoring until 28weeks then reassess  Continue routine antenatal care.  Cont weekly 17 OH-P  Patient anticipating discharge this week

## 2013-01-23 NOTE — Progress Notes (Signed)
Patient ID: Tiffany Floyd, female   DOB: October 08, 1981, 32 y.o.   MRN: [id number] ACULTY PRACTICE ANTEPARTUM COMPREHENSIVE PROGRESS NOTE  Tiffany Floyd is a 32 y.o. [id number] at [redacted]w[redacted]d  who is admitted for Preterm labor.   Fetal presentation is cephalic. Length of Stay:  18  Days  Subjective: Pt with non complaints at present.  Patient reports good fetal movement.  She reports no uterine contractions, no bleeding and no loss of fluid per vagina.  Vitals:  Blood pressure 129/68, pulse 88, temperature 97.7 F (36.5 C), temperature source Oral, resp. rate 18, height 5\' 4"  (1.626 m), weight 167 lb (75.751 kg), last menstrual period 05/17/2012. Physical Examination: General appearance - alert, well appearing, and in no distress Abdomen - soft, nontender, nondistended, no masses or organomegaly gravid Extremities: extremities normal, atraumatic, no cyanosis or edemaMembranes:intact  Fetal Monitoring:  Baseline: 140-150's bpm, Variability: Good {> 6 bpm) and Accelerations: Non-reactive but appropriate for gestational age  Labs:  No results found for this or any previous visit (from the past 24 hour(s)).  Imaging Studies:    n/a   Medications:  Scheduled    . docusate sodium  100 mg Oral Daily  . hydroxyprogesterone caproate  250 mg Intramuscular Weekly  . oxyCODONE-acetaminophen  1-2 tablet Oral Once  . prenatal multivitamin  1 tablet Oral Daily   I have reviewed the patient's current medications.  ASSESSMENT: Patient Active Problem List  Diagnosis  . Dyspnea  . Supervision of other high-risk pregnancy  . Previous cesarean section  . History of preterm delivery, currently pregnant  . Short cervix affecting pregnancy  . Encounter for sterilization    PLAN: Appt with MFM on the 28th Continue inpt care for now Continue routine antenatal care.   HARRAWAY-SMITH, Ganesh Deeg 01/23/2013,7:29 AM

## 2013-01-23 NOTE — Progress Notes (Signed)
Ur chart review completed.  

## 2013-01-24 LAB — TYPE AND SCREEN
ABO/RH(D): A POS
Antibody Screen: NEGATIVE

## 2013-01-24 NOTE — Progress Notes (Signed)
Patient ID: Tiffany Floyd, female   DOB: 1981/01/16, 32 y.o.   MRN: [id number]  FACULTY PRACTICE ANTEPARTUM(COMPREHENSIVE) NOTE  Tiffany Floyd is a 32 y.o. [id number] at [redacted]w[redacted]d  who is admitted for incompetent cervix.   Length of Stay:  19  Days  Subjective: Patient reports the fetal movement as active. Patient reports uterine contraction  activity as none. Patient reports  vaginal bleeding as none. Patient describes fluid per vagina as None.  Vitals:  Blood pressure 106/54, pulse 89, temperature 98.3 F (36.8 C), temperature source Oral, resp. rate 20, height 5\' 4"  (1.626 m), weight 167 lb (75.751 kg), last menstrual period 05/17/2012. Physical Examination:  General appearance - alert, well appearing, and in no distress Abdomen - soft, nontender, nondistended, no masses or organomegaly Extremities - no edema, redness or tenderness in the calves or thighs, Homan's sign negative bilaterally  Fetal Monitoring:  Baseline: 140 bpm, Variability: Good {> 6 bpm), Accelerations: Reactive and Decelerations: Absent  Labs:  No results found for this or any previous visit (from the past 24 hour(s)).   Medications:  Scheduled    . docusate sodium  100 mg Oral Daily  . hydroxyprogesterone caproate  250 mg Intramuscular Weekly  . oxyCODONE-acetaminophen  1-2 tablet Oral Once  . prenatal multivitamin  1 tablet Oral Daily    ASSESSMENT: Patient Active Problem List  Diagnosis  . Dyspnea  . Supervision of other high-risk pregnancy  . Previous cesarean section  . History of preterm delivery, currently pregnant  . Short cervix affecting pregnancy  . Encounter for sterilization    PLAN: Continue bedrest Plan is to be re-evaluated at 28 weeks and d/c home if cervix stable.  Tiffany Floyd H. 01/24/2013,7:38 AM

## 2013-01-25 NOTE — Progress Notes (Signed)
Patient ID: Tiffany Floyd, female   DOB: 1981/03/20, 32 y.o.   MRN: [id number] FACULTY PRACTICE ANTEPARTUM(COMPREHENSIVE) NOTE  Tiffany Floyd is a 32 y.o. [id number] at [redacted]w[redacted]d by best clinical estimate who is admitted for shortened cervix.   Fetal presentation is unsure. Length of Stay:  20  Days  Subjective: Feels well.  Does not want to go home, secondary to no help and stairs at home. Patient reports the fetal movement as active. Patient reports uterine contraction  activity as none. Patient reports  vaginal bleeding as none. Patient describes fluid per vagina as None.  Vitals:  Blood pressure 120/70, pulse 84, temperature 98.3 F (36.8 C), temperature source Oral, resp. rate 18, height 5\' 4"  (1.626 m), weight 167 lb (75.751 kg), last menstrual period 05/17/2012. Physical Examination:  General appearance - alert, well appearing, and in no distress Abdomen - soft, nontender, nondistended, no masses or organomegaly Fundal Height:  size equals dates and non-tender Extremities: extremities normal, atraumatic, no cyanosis or edema  Membranes:intact  Fetal Monitoring:  Baseline: 135 bpm, Variability: Good {> 6 bpm), Accelerations: Reactive and Decelerations: Absent  Labs:  Recent Results (from the past 24 hour(s))  TYPE AND SCREEN   Collection Time   01/24/13  8:09 AM      Component Value Range   ABO/RH(D) A POS     Antibody Screen NEG     Sample Expiration 01/27/2013      Medications:  Scheduled    . docusate sodium  100 mg Oral Daily  . hydroxyprogesterone caproate  250 mg Intramuscular Weekly  . oxyCODONE-acetaminophen  1-2 tablet Oral Once  . prenatal multivitamin  1 tablet Oral Daily   I have reviewed the patient's current medications.  ASSESSMENT: Patient Active Problem List  Diagnosis  . Dyspnea  . Supervision of other high-risk pregnancy  . Previous cesarean section  . History of preterm delivery, currently pregnant  . Short cervix affecting pregnancy  .  Encounter for sterilization    PLAN: Doing well.  Plan for re-eval at 28 wks with MFM and cervical length.  Also, for 17 P on tomorrow.  Possible D/C then.  Aubery Date S 01/25/2013,7:05 AM

## 2013-01-26 NOTE — Progress Notes (Signed)
UR complete 

## 2013-01-26 NOTE — Progress Notes (Signed)
FACULTY PRACTICE ANTEPARTUM(COMPREHENSIVE) NOTE  Tiffany Floyd is a 32 y.o. [phone] at [redacted]w[redacted]d by best clinical estimate who is admitted for short cervix, despite cerclage which is still present.   Fetal presentation is unsure. Length of Stay:  21  Days  Subjective:  Patient reports the fetal movement as . Patient reports uterine contraction  activity as none. Patient reports  vaginal bleeding as none. Patient describes fluid per vagina as None.  Vitals:  Blood pressure 137/76, pulse 97, temperature 97.9 F (36.6 C), temperature source Oral, resp. rate 18, height 5\' 4"  (1.626 m), weight 75.751 kg (167 lb), last menstrual period 05/17/2012. Physical Examination:  General appearance - alert, well appearing, and in no distress Heart - normal rate and regular rhythm Abdomen - soft, nontender, nondistended Fundal Height:  size equals dates Cervical Exam: Not evaluated. . Extremities: extremities normal, atraumatic, no cyanosis or edema and Homans sign is negative, no sign of DVT with DTRs 2+ bilaterally Membranes:intact  Fetal Monitoring:  daily nst  unchanged.  Labs:  No results found for this or any previous visit (from the past 24 hour(s)).  Imaging Studies:  Marland Kitchen  Medications:  Scheduled    . docusate sodium  100 mg Oral Daily  . hydroxyprogesterone caproate  250 mg Intramuscular Weekly  . oxyCODONE-acetaminophen  1-2 tablet Oral Once  . prenatal multivitamin  1 tablet Oral Daily   I have reviewed the patient's current medications.  ASSESSMENT: Patient Active Problem List  Diagnosis  . Dyspnea  . Supervision of other high-risk pregnancy  . Previous cesarean section  . History of preterm delivery, currently pregnant  . Short cervix affecting pregnancy  . Encounter for sterilization    PLAN:17-P TODAY. Repeat u/s and MFM eval scheduled For Friday. Possible d/c then.  Jeraldean Wechter V 01/26/2013,7:36 AM

## 2013-01-27 ENCOUNTER — Encounter (HOSPITAL_COMMUNITY): Payer: Medicaid Other

## 2013-01-27 ENCOUNTER — Inpatient Hospital Stay (HOSPITAL_COMMUNITY): Payer: Medicaid Other

## 2013-01-27 ENCOUNTER — Ambulatory Visit (HOSPITAL_COMMUNITY): Payer: MEDICAID

## 2013-01-27 LAB — TYPE AND SCREEN
ABO/RH(D): A POS
Antibody Screen: NEGATIVE

## 2013-01-27 MED ORDER — DSS 100 MG PO CAPS: 100.0000 mg | ORAL_CAPSULE | Freq: Two times a day (BID) | ORAL | Status: DC

## 2013-01-27 NOTE — Progress Notes (Signed)
Mother of pt requesting a phone call from MD concerning the pt being discharged to home.  MD made aware and given the phone number

## 2013-01-27 NOTE — Discharge Summary (Signed)
Physician Discharge Summary  Patient ID: Tiffany Floyd Floyd MRN: [id number] DOB/AGE: 33/09/1981 33 y.o.  Admit date: 01/05/2013 Discharge date: 01/27/2013  Admission Diagnoses: Short cervix with cerclage in situ  Discharge Diagnoses: Same with improvement in cervical length following prolonged period of pelvic breast Principal Problem:  *Short cervix affecting pregnancy Active Problems:  Previous cesarean section  History of preterm delivery, currently pregnant   Discharged Condition: good  Hospital Course: patient admitted at [redacted]w[redacted]d secondary to incompetent cervix with cerclage in situ and cervical length of 0.4 cm. Patient was admitted for bed rest and observation. She received BMZ prior to admission on 1/7 and 1/8. Patient continued to receive her weekly 17-P while hospitalized. Patient was hemodynamically stable. Fetal status remained reassuring. Repeat transvaginal ultrasound today demonstrated a cervical length of 1.4 cm. Ultrasound was reviewed with Dr. Sherrie George who agreed with discharge and hospital follow-up. Patient was eager to be discharged. Discharge instructions involving modified bed rest were provided to the patient. Patient verbalized understanding of limiting her physical activities. Out patient plan of care explained to the patient's mother over the phone [phone]). Patient's mother explained that she will not be able to care for her daughter, her grand child and her own mother who is recovering from heart surgery. I explained to the patient's mother that [name] should limit her physical activities as much as she can but that she can take a quick shower, prepare a quick meal and that I expected her to sit as much as she can, whenever she can. Patient's mother seems to feel reassured with this plan along with her upcoming follow-up appointments.  Consults: MFM  Significant Diagnostic Studies: radiology: Ultrasound: 1/31: CL 1.4 cm  Treatments: bedrest and weekly 17-p  Discharge  Exam: Blood pressure 114/61, pulse 87, temperature 98.7 F (37.1 C), temperature source Oral, resp. rate 18, height 5\' 4"  (1.626 m), weight 167 lb (75.751 kg), last menstrual period 05/17/2012. General appearance: alert, cooperative and no distress GI: soft, gravid, NT Extremities: Homans sign is negative, no sign of DVT and no edema, redness or tenderness in the calves or thighs FHT: baseline 145, moderate variability, no accels, no decels Toco: no contractions  Disposition: 01-Home or Self Care      Discharge Orders    Future Appointments: Provider: Department: Dept Phone: Center:   02/02/2013 10:00 AM Eino Farber Paul Half, CNM [facility] [phone] [facility]   02/10/2013 10:30 AM [facility] [place] [facility]TRASOUND [phone] [facility]     Future Orders Please Complete By Expires   [facility] OB Transvaginal  02/10/13 03/27/14   Questions: Responses:   Preferred imaging location? [facility]   Reason for exam: cl       Medication List     As of 01/27/2013 11:44 AM    TAKE these medications         DSS 100 MG Caps   Take 100 mg by mouth 2 (two) times daily.      hydroxyprogesterone caproate 250 mg/mL Oil   Commonly known as: DELALUTIN   Inject 250 mg into the muscle once a week. Pt gets every Thursday      prenatal multivitamin Tabs   Take 1 tablet by mouth daily.        Follow-up Information    Follow up with [facility]. On 02/02/2013. (at 10 am)    Contact information:   [address] [place] [address] [phone]         Signed:  Rober Skeels 01/27/2013, 11:44 AM

## 2013-01-27 NOTE — Progress Notes (Signed)
MD talking to pt about discharge instructions and expectations while at home.

## 2013-01-27 NOTE — Progress Notes (Signed)
Pt given discharge instructions and verlbalizes understanding 

## 2013-01-30 NOTE — Progress Notes (Signed)
Patient received 17P injection. Follow up for weekly injections and MD visits as scheduled.

## 2013-02-02 ENCOUNTER — Ambulatory Visit (INDEPENDENT_AMBULATORY_CARE_PROVIDER_SITE_OTHER): Payer: Medicaid Other | Admitting: Family

## 2013-02-02 VITALS — BP 113/68 | Temp 96.5°F | Wt 171.3 lb

## 2013-02-02 DIAGNOSIS — O09219 Supervision of pregnancy with history of pre-term labor, unspecified trimester: Secondary | ICD-10-CM

## 2013-02-02 DIAGNOSIS — Z302 Encounter for sterilization: Secondary | ICD-10-CM

## 2013-02-02 LAB — POCT URINALYSIS DIP (DEVICE)
Bilirubin Urine: NEGATIVE
Nitrite: NEGATIVE
Protein, ur: NEGATIVE mg/dL
Specific Gravity, Urine: 1.02 (ref 1.005–1.030)
Urobilinogen, UA: 0.2 mg/dL (ref 0.0–1.0)

## 2013-02-02 MED ORDER — HYDROXYPROGESTERONE CAPROATE 250 MG/ML IM OIL
250.0000 mg | TOPICAL_OIL | Freq: Once | INTRAMUSCULAR | Status: AC
  Administered 2013-02-02: 250 mg via INTRAMUSCULAR

## 2013-02-02 NOTE — Progress Notes (Signed)
Doing well; no questions or concerns; discharged from hospital for short cervical length; received BMZ; 17-p

## 2013-02-09 ENCOUNTER — Encounter: Payer: Medicaid Other | Admitting: Obstetrics & Gynecology

## 2013-02-10 ENCOUNTER — Ambulatory Visit (HOSPITAL_COMMUNITY): Admit: 2013-02-10 | Payer: MEDICAID

## 2013-02-13 ENCOUNTER — Ambulatory Visit (INDEPENDENT_AMBULATORY_CARE_PROVIDER_SITE_OTHER): Payer: Medicaid Other | Admitting: *Deleted

## 2013-02-13 ENCOUNTER — Ambulatory Visit (HOSPITAL_COMMUNITY)
Admission: RE | Admit: 2013-02-13 | Discharge: 2013-02-13 | Disposition: A | Discharge status: Home / Self Care | Payer: Medicaid Other | Source: Ambulatory Visit | Attending: Obstetrics & Gynecology | Admitting: Obstetrics & Gynecology

## 2013-02-13 VITALS — BP 115/70 | HR 90

## 2013-02-13 VITALS — BP 110/59 | HR 69 | Wt 173.0 lb

## 2013-02-13 DIAGNOSIS — Z302 Encounter for sterilization: Secondary | ICD-10-CM

## 2013-02-13 DIAGNOSIS — O09299 Supervision of pregnancy with other poor reproductive or obstetric history, unspecified trimester: Secondary | ICD-10-CM | POA: Insufficient documentation

## 2013-02-13 DIAGNOSIS — O3410 Maternal care for benign tumor of corpus uteri, unspecified trimester: Secondary | ICD-10-CM | POA: Insufficient documentation

## 2013-02-13 DIAGNOSIS — O34219 Maternal care for unspecified type scar from previous cesarean delivery: Secondary | ICD-10-CM | POA: Insufficient documentation

## 2013-02-13 DIAGNOSIS — O26879 Cervical shortening, unspecified trimester: Secondary | ICD-10-CM | POA: Insufficient documentation

## 2013-02-13 DIAGNOSIS — O09219 Supervision of pregnancy with history of pre-term labor, unspecified trimester: Secondary | ICD-10-CM

## 2013-02-13 DIAGNOSIS — Z8751 Personal history of pre-term labor: Secondary | ICD-10-CM | POA: Insufficient documentation

## 2013-02-13 DIAGNOSIS — O09899 Supervision of other high risk pregnancies, unspecified trimester: Secondary | ICD-10-CM

## 2013-02-13 MED ORDER — HYDROXYPROGESTERONE CAPROATE 250 MG/ML IM OIL
250.0000 mg | TOPICAL_OIL | Freq: Once | INTRAMUSCULAR | Status: AC
  Administered 2013-02-13: 250 mg via INTRAMUSCULAR

## 2013-02-13 NOTE — Progress Notes (Signed)
Tiffany Floyd  was seen today for an ultrasound appointment.  See full report in [facility].  Impression: Single IUP at 30 3/7 weeks Suspected cervical insufficiency, s/p cerclage TVUS for cervical length only - a cervical length of 1.3 cm is noted (stable). The cerclage stitch appears intact.  Some funneling noted, but not below the cerclage stitch.  Recommendations: Recommend continued modified bedrest at home and weekly 17-P injections No additional cervical length surveillance needed Follow up ultrasound in 2 weeks for interval growth  Alpha Gula, MD

## 2013-02-15 ENCOUNTER — Ambulatory Visit (HOSPITAL_COMMUNITY): Payer: MEDICAID

## 2013-02-16 ENCOUNTER — Encounter: Payer: Medicaid Other | Admitting: Obstetrics and Gynecology

## 2013-02-19 ENCOUNTER — Other Ambulatory Visit: Payer: Self-pay | Admitting: Obstetrics and Gynecology

## 2013-02-19 NOTE — H&P (Addendum)
Tiffany Floyd is a 32 y.o. female presenting for evaluation to rule out preterm labor. 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 Cervical exam by Dr Cira Servant, found cervix to be 3 cm/90%/-1 station and Dr Cira Servant felt cerclage had pulled through the cervix and was no longer encircling the ext os.This is to be reassessed. Pt discomforts are 10 second in duration, no contractions noted on EFM. Pt has not had recent BM. No sexual activity. . Maternal Medical History:  Reason for admission: Contractions.   Fetal activity: Perceived fetal activity is normal.   Last perceived fetal movement was within the past hour.    Prenatal complications: Preterm labor.     OB History   Grav Para Term Preterm Abortions TAB SAB Ect Mult Living   5 2 1 1 2 1 1  0 0 1     Past Medical History  Diagnosis Date  . Post traumatic stress disorder (PTSD)   . Explosive personality disorder    Past Surgical History  Procedure Laterality Date  . Cesarean section  03/10/2012    Procedure: CESAREAN SECTION;  Surgeon: Tereso Newcomer, MD;  Location: [facility];  Service: Gynecology;  Laterality: N/A;  Primary Cesarean Section Delivery Boy @ 2344,  . Cervical cerclage  12/02/2012    Procedure: CERCLAGE CERVICAL;  Surgeon: Tereso Newcomer, MD;  Location: [facility];  Service: Gynecology;  Laterality: N/A;   Family History: family history is negative for Anesthesia problems. Social History:  reports that she has never smoked. She has never used smokeless tobacco. She reports that she does not drink alcohol or use illicit drugs.   Prenatal Transfer Tool  Maternal Diabetes: No Genetic Screening: Normal Maternal Ultrasounds/Referrals: Abnormal:  Findings:   Other: short cervix  CL 1.8 cm at last eval early Feb,with cerclage Fetal  Ultrasounds or other Referrals:  None Maternal Substance Abuse:  No Significant Maternal Medications:  None Significant Maternal Lab Results:  Lab values include: Group B Strep negative Other Comments:  s/p Betamethasone x 2 doses  Review of Systems  Genitourinary: Negative.       Last menstrual period 05/17/2012. Maternal Exam:  Uterine Assessment: Contraction frequency is irregular.   Abdomen: Patient reports no abdominal tenderness. Surgical scars: low transverse.   Fundal height is c/w dates.   Fetal presentation: vertex  Introitus: Normal vulva. Normal vagina.  Ferning test: not done.  Nitrazine test: not done.  Pelvis: adequate for delivery.   Cervix: Cervix evaluated by digital exam.     Physical Exam  Constitutional: She is oriented to person, place, and time. She appears well-developed and well-nourished.  Anxious, somewhat distrusting demeanor  HENT:  Head: Normocephalic and atraumatic.  Eyes: Pupils are equal, round, and reactive to light.  Neck: Neck supple.  Cardiovascular: Normal rate.   Respiratory: Effort normal.  GI: Soft.  Neurological: She is alert and oriented to person, place, and time. She has normal reflexes.  Psychiatric: She has a normal mood and affect.   Cervix 4-5 cm 100%, -2 midposition, vertex, intact BOW palpable , bulging Prenatal labs: ABO, Rh: --/--/A POS (01/31 0810) Antibody: NEG (01/31 0810) Rubella: Immune (10/02 0000) RPR: NON REACTIVE (01/25 0732)  HBsAg: Negative (10/02 0000)  HIV: NON REACTIVE (01/25 0732)  GBS: Unknown (03/14 0000)   Assessment/Plan:  Pregnancy 31+3 weeks, advanced cervical dilation, recurrent cervical incompetence, s/p cerclage, to be removed.  Kylar Leonhardt V 02/19/2013, 10:13 PM

## 2013-02-20 ENCOUNTER — Encounter (HOSPITAL_COMMUNITY): Payer: Self-pay | Admitting: Anesthesiology

## 2013-02-20 ENCOUNTER — Encounter (HOSPITAL_COMMUNITY)
Admission: AD | Disposition: A | Discharge status: Home / Self Care | Payer: Self-pay | Source: Ambulatory Visit | Attending: Obstetrics and Gynecology

## 2013-02-20 ENCOUNTER — Encounter (HOSPITAL_COMMUNITY): Payer: Self-pay | Admitting: *Deleted

## 2013-02-20 ENCOUNTER — Inpatient Hospital Stay (HOSPITAL_COMMUNITY)
Admission: AD | Admit: 2013-02-20 | Discharge: 2013-02-22 | DRG: 767 | Disposition: A | Discharge status: Home / Self Care | Payer: Medicaid Other | Source: Ambulatory Visit | Attending: Obstetrics and Gynecology | Admitting: Obstetrics and Gynecology

## 2013-02-20 ENCOUNTER — Observation Stay (HOSPITAL_COMMUNITY): Payer: Medicaid Other | Admitting: Anesthesiology

## 2013-02-20 DIAGNOSIS — O3430 Maternal care for cervical incompetence, unspecified trimester: Secondary | ICD-10-CM

## 2013-02-20 DIAGNOSIS — O09899 Supervision of other high risk pregnancies, unspecified trimester: Secondary | ICD-10-CM

## 2013-02-20 DIAGNOSIS — O34219 Maternal care for unspecified type scar from previous cesarean delivery: Secondary | ICD-10-CM

## 2013-02-20 DIAGNOSIS — O0940 Supervision of pregnancy with grand multiparity, unspecified trimester: Secondary | ICD-10-CM

## 2013-02-20 DIAGNOSIS — Z302 Encounter for sterilization: Secondary | ICD-10-CM

## 2013-02-20 HISTORY — PX: CERVICAL CERCLAGE: SHX1329

## 2013-02-20 LAB — URINE MICROSCOPIC-ADD ON

## 2013-02-20 LAB — URINALYSIS, ROUTINE W REFLEX MICROSCOPIC
Protein, ur: NEGATIVE mg/dL
Urobilinogen, UA: 0.2 mg/dL (ref 0.0–1.0)

## 2013-02-20 LAB — TYPE AND SCREEN: ABO/RH(D): A POS

## 2013-02-20 LAB — CBC
MCHC: 33.4 g/dL (ref 30.0–36.0)
MCV: 89.2 fL (ref 78.0–100.0)
Platelets: 200 10*3/uL (ref 150–400)
RDW: 13.3 % (ref 11.5–15.5)

## 2013-02-20 SURGERY — Surgical Case
Anesthesia: Regional

## 2013-02-20 SURGERY — CERCLAGE, CERVIX, VAGINAL APPROACH
Anesthesia: Epidural

## 2013-02-20 MED ORDER — PHENYLEPHRINE 40 MCG/ML (10ML) SYRINGE FOR IV PUSH (FOR BLOOD PRESSURE SUPPORT)
80.0000 ug | PREFILLED_SYRINGE | INTRAVENOUS | Status: DC | PRN
  Filled 2013-02-20: qty 5

## 2013-02-20 MED ORDER — OXYCODONE-ACETAMINOPHEN 5-325 MG PO TABS
1.0000 | ORAL_TABLET | ORAL | Status: DC | PRN
  Administered 2013-02-21 – 2013-02-22 (×3): 1 via ORAL
  Filled 2013-02-20 (×3): qty 1

## 2013-02-20 MED ORDER — IBUPROFEN 600 MG PO TABS
600.0000 mg | ORAL_TABLET | Freq: Four times a day (QID) | ORAL | Status: DC
  Administered 2013-02-20 – 2013-02-22 (×6): 600 mg via ORAL
  Filled 2013-02-20 (×7): qty 1

## 2013-02-20 MED ORDER — BENZOCAINE-MENTHOL 20-0.5 % EX AERO: 1.0000 "application " | INHALATION_SPRAY | CUTANEOUS | Status: DC | PRN

## 2013-02-20 MED ORDER — EPHEDRINE 5 MG/ML INJ: 10.0000 mg | INTRAVENOUS | Status: DC | PRN

## 2013-02-20 MED ORDER — FENTANYL 2.5 MCG/ML BUPIVACAINE 1/10 % EPIDURAL INFUSION (WH - ANES)
INTRAMUSCULAR | Status: DC | PRN
  Administered 2013-02-20: 14 mL/h via EPIDURAL

## 2013-02-20 MED ORDER — LACTATED RINGERS IV SOLN: 500.0000 mL | INTRAVENOUS | Status: DC | PRN

## 2013-02-20 MED ORDER — LIDOCAINE HCL (PF) 1 % IJ SOLN
30.0000 mL | INTRAMUSCULAR | Status: DC | PRN
  Filled 2013-02-20: qty 30

## 2013-02-20 MED ORDER — ONDANSETRON HCL 4 MG/2ML IJ SOLN: 4.0000 mg | Freq: Four times a day (QID) | INTRAMUSCULAR | Status: DC | PRN

## 2013-02-20 MED ORDER — WITCH HAZEL-GLYCERIN EX PADS: 1.0000 "application " | MEDICATED_PAD | CUTANEOUS | Status: DC | PRN

## 2013-02-20 MED ORDER — OXYTOCIN 40 UNITS IN LACTATED RINGERS INFUSION - SIMPLE MED
200.0000 mL/h | INTRAVENOUS | Status: DC
  Administered 2013-02-20: 200 mL/h via INTRAVENOUS
  Filled 2013-02-20: qty 1000

## 2013-02-20 MED ORDER — SODIUM CHLORIDE 0.9 % IV SOLN: 250.0000 mL | INTRAVENOUS | Status: DC | PRN

## 2013-02-20 MED ORDER — CEFAZOLIN SODIUM-DEXTROSE 2-3 GM-% IV SOLR
INTRAVENOUS | Status: AC
  Filled 2013-02-20: qty 50

## 2013-02-20 MED ORDER — HYDROXYPROGESTERONE CAPROATE 250 MG/ML IM OIL: 250.0000 mg | TOPICAL_OIL | INTRAMUSCULAR | Status: DC

## 2013-02-20 MED ORDER — PRENATAL MULTIVITAMIN CH: 1.0000 | ORAL_TABLET | Freq: Every day | ORAL | Status: DC

## 2013-02-20 MED ORDER — SENNOSIDES-DOCUSATE SODIUM 8.6-50 MG PO TABS
2.0000 | ORAL_TABLET | Freq: Every day | ORAL | Status: DC
  Administered 2013-02-20 – 2013-02-21 (×2): 2 via ORAL

## 2013-02-20 MED ORDER — ONDANSETRON HCL 4 MG/2ML IJ SOLN: 4.0000 mg | INTRAMUSCULAR | Status: DC | PRN

## 2013-02-20 MED ORDER — DOCUSATE SODIUM 100 MG PO CAPS: 100.0000 mg | ORAL_CAPSULE | Freq: Every day | ORAL | Status: DC

## 2013-02-20 MED ORDER — FENTANYL 2.5 MCG/ML BUPIVACAINE 1/10 % EPIDURAL INFUSION (WH - ANES)
14.0000 mL/h | INTRAMUSCULAR | Status: DC
  Filled 2013-02-20: qty 125

## 2013-02-20 MED ORDER — OXYTOCIN 40 UNITS IN LACTATED RINGERS INFUSION - SIMPLE MED
62.5000 mL/h | INTRAVENOUS | Status: DC
  Administered 2013-02-20: 999 mL/h via INTRAVENOUS
  Filled 2013-02-20: qty 1000

## 2013-02-20 MED ORDER — CALCIUM CARBONATE ANTACID 500 MG PO CHEW: 2.0000 | CHEWABLE_TABLET | ORAL | Status: DC | PRN

## 2013-02-20 MED ORDER — TETANUS-DIPHTH-ACELL PERTUSSIS 5-2.5-18.5 LF-MCG/0.5 IM SUSP: 0.5000 mL | Freq: Once | INTRAMUSCULAR | Status: DC

## 2013-02-20 MED ORDER — EPHEDRINE SULFATE 50 MG/ML IJ SOLN
INTRAMUSCULAR | Status: DC | PRN
  Administered 2013-02-20 (×3): 10 mg via INTRAVENOUS

## 2013-02-20 MED ORDER — EPHEDRINE 5 MG/ML INJ
INTRAVENOUS | Status: AC
  Filled 2013-02-20: qty 10

## 2013-02-20 MED ORDER — CITRIC ACID-SODIUM CITRATE 334-500 MG/5ML PO SOLN
ORAL | Status: AC
  Administered 2013-02-20: 30 mL
  Filled 2013-02-20: qty 15

## 2013-02-20 MED ORDER — ACETAMINOPHEN 325 MG PO TABS: 650.0000 mg | ORAL_TABLET | ORAL | Status: DC | PRN

## 2013-02-20 MED ORDER — CITRIC ACID-SODIUM CITRATE 334-500 MG/5ML PO SOLN: 30.0000 mL | ORAL | Status: DC | PRN

## 2013-02-20 MED ORDER — LANOLIN HYDROUS EX OINT: TOPICAL_OINTMENT | CUTANEOUS | Status: DC | PRN

## 2013-02-20 MED ORDER — BUPIVACAINE IN DEXTROSE 0.75-8.25 % IT SOLN
INTRATHECAL | Status: AC
  Filled 2013-02-20: qty 2

## 2013-02-20 MED ORDER — OXYTOCIN BOLUS FROM INFUSION: 500.0000 mL | INTRAVENOUS | Status: DC

## 2013-02-20 MED ORDER — DIPHENHYDRAMINE HCL 25 MG PO CAPS: 25.0000 mg | ORAL_CAPSULE | Freq: Four times a day (QID) | ORAL | Status: DC | PRN

## 2013-02-20 MED ORDER — DIBUCAINE 1 % RE OINT: 1.0000 "application " | TOPICAL_OINTMENT | RECTAL | Status: DC | PRN

## 2013-02-20 MED ORDER — LACTATED RINGERS IV SOLN
INTRAVENOUS | Status: DC
  Administered 2013-02-20: 13:00:00 via INTRAVENOUS

## 2013-02-20 MED ORDER — SODIUM CHLORIDE 0.9 % IJ SOLN: 3.0000 mL | Freq: Two times a day (BID) | INTRAMUSCULAR | Status: DC

## 2013-02-20 MED ORDER — OXYCODONE-ACETAMINOPHEN 5-325 MG PO TABS: 1.0000 | ORAL_TABLET | ORAL | Status: DC | PRN

## 2013-02-20 MED ORDER — ONDANSETRON HCL 4 MG PO TABS: 4.0000 mg | ORAL_TABLET | ORAL | Status: DC | PRN

## 2013-02-20 MED ORDER — DIPHENHYDRAMINE HCL 50 MG/ML IJ SOLN: 12.5000 mg | INTRAMUSCULAR | Status: DC | PRN

## 2013-02-20 MED ORDER — ZOLPIDEM TARTRATE 5 MG PO TABS: 5.0000 mg | ORAL_TABLET | Freq: Every evening | ORAL | Status: DC | PRN

## 2013-02-20 MED ORDER — LACTATED RINGERS IV SOLN
INTRAVENOUS | Status: DC
  Administered 2013-02-20 (×3): via INTRAVENOUS

## 2013-02-20 MED ORDER — LACTATED RINGERS IV SOLN: 500.0000 mL | Freq: Once | INTRAVENOUS | Status: DC

## 2013-02-20 MED ORDER — IBUPROFEN 600 MG PO TABS: 600.0000 mg | ORAL_TABLET | Freq: Four times a day (QID) | ORAL | Status: DC | PRN

## 2013-02-20 MED ORDER — BUTORPHANOL TARTRATE 1 MG/ML IJ SOLN
1.0000 mg | Freq: Once | INTRAMUSCULAR | Status: AC
  Administered 2013-02-20: 1 mg via INTRAVENOUS
  Filled 2013-02-20: qty 1

## 2013-02-20 MED ORDER — MAGNESIUM SULFATE 40 G IN LACTATED RINGERS - SIMPLE
2.0000 g/h | INTRAVENOUS | Status: DC
  Administered 2013-02-20: 2 g/h via INTRAVENOUS
  Filled 2013-02-20: qty 500

## 2013-02-20 MED ORDER — LIDOCAINE HCL (PF) 1 % IJ SOLN
INTRAMUSCULAR | Status: DC | PRN
  Administered 2013-02-20: 5 mL

## 2013-02-20 MED ORDER — MAGNESIUM SULFATE 40 G IN LACTATED RINGERS - SIMPLE
2.0000 g/h | INTRAVENOUS | Status: DC
  Filled 2013-02-20: qty 500

## 2013-02-20 MED ORDER — SODIUM CHLORIDE 0.9 % IJ SOLN
3.0000 mL | Freq: Two times a day (BID) | INTRAMUSCULAR | Status: DC
  Administered 2013-02-20 – 2013-02-21 (×2): 3 mL via INTRAVENOUS

## 2013-02-20 MED ORDER — PRENATAL MULTIVITAMIN CH
1.0000 | ORAL_TABLET | Freq: Every day | ORAL | Status: DC
  Administered 2013-02-20 – 2013-02-22 (×3): 1 via ORAL
  Filled 2013-02-20 (×3): qty 1

## 2013-02-20 MED ORDER — PHENYLEPHRINE 40 MCG/ML (10ML) SYRINGE FOR IV PUSH (FOR BLOOD PRESSURE SUPPORT): 80.0000 ug | PREFILLED_SYRINGE | INTRAVENOUS | Status: DC | PRN

## 2013-02-20 MED ORDER — SODIUM CHLORIDE 0.9 % IJ SOLN: 3.0000 mL | INTRAMUSCULAR | Status: DC | PRN

## 2013-02-20 MED ORDER — CEFAZOLIN SODIUM-DEXTROSE 2-3 GM-% IV SOLR
INTRAVENOUS | Status: DC | PRN
  Administered 2013-02-20: 2 g via INTRAVENOUS

## 2013-02-20 MED ORDER — SIMETHICONE 80 MG PO CHEW: 80.0000 mg | CHEWABLE_TABLET | ORAL | Status: DC | PRN

## 2013-02-20 SURGICAL SUPPLY — 21 items
CATH ROBINSON RED A/P 16FR (CATHETERS) ×2 IMPLANT
CLOTH BEACON ORANGE TIMEOUT ST (SAFETY) ×2 IMPLANT
COUNTER NEEDLE 1200 MAGNETIC (NEEDLE) IMPLANT
GLOVE BIO SURGEON ST LM GN SZ9 (GLOVE) ×2 IMPLANT
GLOVE BIO SURGEON STRL SZ7 (GLOVE) ×2 IMPLANT
GLOVE BIOGEL PI IND STRL 9 (GLOVE) ×2 IMPLANT
GLOVE BIOGEL PI INDICATOR 9 (GLOVE) ×2
GOWN PREVENTION PLUS XLARGE (GOWN DISPOSABLE) ×2 IMPLANT
GOWN STRL REIN 3XL LVL4 (GOWN DISPOSABLE) ×2 IMPLANT
GOWN STRL REIN XL XLG (GOWN DISPOSABLE) ×4 IMPLANT
NEEDLE MAYO .5 CIRCLE (NEEDLE) IMPLANT
PACK VAGINAL MINOR WOMEN LF (CUSTOM PROCEDURE TRAY) ×2 IMPLANT
PAD OB MATERNITY 4.3X12.25 (PERSONAL CARE ITEMS) ×2 IMPLANT
PAD PREP 24X48 CUFFED NSTRL (MISCELLANEOUS) ×2 IMPLANT
SUT MERSILENE 5MM BP 1 12 (SUTURE) IMPLANT
SUT PROLENE 1 CT 1 30 (SUTURE) IMPLANT
SUT SILK 2 0 SH (SUTURE) IMPLANT
TOWEL OR 17X24 6PK STRL BLUE (TOWEL DISPOSABLE) ×4 IMPLANT
TUBING NON-CON 1/4 X 20 CONN (TUBING) ×2 IMPLANT
WATER STERILE IRR 1000ML POUR (IV SOLUTION) ×2 IMPLANT
YANKAUER SUCT BULB TIP NO VENT (SUCTIONS) ×2 IMPLANT

## 2013-02-20 NOTE — Transfer of Care (Addendum)
Immediate Anesthesia Transfer of Care Note  Patient: Tiffany Floyd  Procedure(s) Performed: Procedure(s):  Removal of cervical cerclage (N/A)  Patient Location: PACU and Nursing Unit  Anesthesia Type:Spinal  Level of Consciousness: awake, alert  and oriented  Airway & Oxygen Therapy: Patient Spontanous Breathing  Post-op Assessment: Report given to PACU RN and Post -op Vital signs reviewed and stable  Post vital signs: Reviewed and stable  Complications: No apparent anesthesia complications

## 2013-02-20 NOTE — Anesthesia Preprocedure Evaluation (Signed)
Anesthesia Evaluation  Patient identified by MRN, date of birth, ID band Patient awake    Reviewed: Allergy & Precautions, H&P , Patient's Chart, lab work & pertinent test results  Airway Mallampati: II TM Distance: >3 FB Neck ROM: full    Dental no notable dental hx.    Pulmonary  breath sounds clear to auscultation  Pulmonary exam normal       Cardiovascular Exercise Tolerance: Good Rhythm:regular Rate:Normal     Neuro/Psych    GI/Hepatic   Endo/Other    Renal/GU      Musculoskeletal   Abdominal   Peds  Hematology   Anesthesia Other Findings   Reproductive/Obstetrics                           Anesthesia Physical Anesthesia Plan  ASA: II  Anesthesia Plan: Spinal and Epidural   Post-op Pain Management:    Induction:   Airway Management Planned:   Additional Equipment:   Intra-op Plan:   Post-operative Plan:   Informed Consent: I have reviewed the patients History and Physical, chart, labs and discussed the procedure including the risks, benefits and alternatives for the proposed anesthesia with the patient or authorized representative who has indicated his/her understanding and acceptance.   Dental Advisory Given  Plan Discussed with: CRNA  Anesthesia Plan Comments: (Lab work confirmed with CRNA in room. Platelets okay. Discussed spinal anesthetic, and patient consents to the procedure:  included risk of possible headache,backache, failed block, allergic reaction, and nerve injury. This patient was asked if she had any questions or concerns before the procedure started. )        Anesthesia Quick Evaluation  

## 2013-02-20 NOTE — Anesthesia Preprocedure Evaluation (Signed)
Anesthesia Evaluation  Patient identified by MRN, date of birth, ID band Patient awake    Reviewed: Allergy & Precautions, H&P , Patient's Chart, lab work & pertinent test results  Airway Mallampati: II TM Distance: >3 FB Neck ROM: full    Dental no notable dental hx.    Pulmonary  breath sounds clear to auscultation  Pulmonary exam normal       Cardiovascular Exercise Tolerance: Good Rhythm:regular Rate:Normal     Neuro/Psych    GI/Hepatic   Endo/Other    Renal/GU      Musculoskeletal   Abdominal   Peds  Hematology   Anesthesia Other Findings   Reproductive/Obstetrics                           Anesthesia Physical Anesthesia Plan  ASA: II  Anesthesia Plan: Spinal and Epidural   Post-op Pain Management:    Induction:   Airway Management Planned:   Additional Equipment:   Intra-op Plan:   Post-operative Plan:   Informed Consent: I have reviewed the patients History and Physical, chart, labs and discussed the procedure including the risks, benefits and alternatives for the proposed anesthesia with the patient or authorized representative who has indicated his/her understanding and acceptance.   Dental Advisory Given  Plan Discussed with: CRNA  Anesthesia Plan Comments: (Lab work confirmed with CRNA in room. Platelets okay. Discussed spinal anesthetic, and patient consents to the procedure:  included risk of possible headache,backache, failed block, allergic reaction, and nerve injury. This patient was asked if she had any questions or concerns before the procedure started. )        Anesthesia Quick Evaluation

## 2013-02-20 NOTE — Progress Notes (Signed)
Patient ID: Tiffany Floyd, female   DOB: 1981/11/26, 32 y.o.   MRN: [id number]  Seen by Dr Eric Form from NICU to discuss expectations.   Discussed advanced dilation with Dr Marice Potter.  She recommends discontinuing Magnesium Sulfate.  Will observe for increased labor

## 2013-02-20 NOTE — H&P (Addendum)
Tiffany Floyd is a 32 y.o. female presenting for evaluation to rule out preterm labor. She is at 31+2 WEEKS, and is accepted in transfer from Dr Cira Servant, OB hospitalist at [facility] after pt presented with pelvic discomfort, no bleeding or SROM, after pt presented there while on a SHOPPING TRIP to [employer]. Cervical exam by Dr Cira Servant, found cervix to be 3 cm/90%/-1 station and Dr Cira Servant felt cerclage had pulled through the cervix and was no longer encircling the ext os.This is to be reassessed. Pt discomforts are 10 second in duration, no contractions noted on EFM. Pt has not had recent BM. No sexual activity. . History OB History   Grav Para Term Preterm Abortions TAB SAB Ect Mult Living   5 2 1 1 2 1 1  0 0 1     Past Medical History  Diagnosis Date  . Post traumatic stress disorder (PTSD)   . Explosive personality disorder    Past Surgical History  Procedure Laterality Date  . Cesarean section  03/10/2012    Procedure: CESAREAN SECTION;  Surgeon: Tereso Newcomer, MD;  Location: [facility];  Service: Gynecology;  Laterality: N/A;  Primary Cesarean Section Delivery Boy @ 2344,  . Cervical cerclage  12/02/2012    Procedure: CERCLAGE CERVICAL;  Surgeon: Tereso Newcomer, MD;  Location: [facility];  Service: Gynecology;  Laterality: N/A;   Family History: family history is negative for Anesthesia problems. Social History:  reports that she has never smoked. She has never used smokeless tobacco. She reports that she does not drink alcohol or use illicit drugs.   Prenatal Transfer Tool  Maternal Diabetes: No Genetic Screening: Normal Maternal Ultrasounds/Referrals: Abnormal:  Findings:   Other: short cervix  CL 1.8 cm at last eval early Feb,with cerclage Fetal Ultrasounds or other Referrals:  None Maternal Substance Abuse:  No Significant Maternal Medications:  None Significant Maternal Lab Results:  Lab values include: Group B Strep negative Other Comments:  s/p  Betamethasone x 2 doses  ROS  Dilation: 4.5 Effacement (%): 80 Station: -1 Exam by:: Dr Emelda Fear Blood pressure 114/61, pulse 98, temperature 97.9 F (36.6 C), temperature source Oral, resp. rate 18, height 5\' 3"  (1.6 m), weight 77.565 kg (171 lb), last menstrual period 05/17/2012. Exam Physical Exam  Constitutional: She is oriented to person, place, and time. She appears well-developed and well-nourished.  On Magnesium Sulfate, at 2 gm /hour. Will continue  HENT:  Head: Normocephalic and atraumatic.  Eyes: Pupils are equal, round, and reactive to light.  Neck: Neck supple.  Cardiovascular: Normal rate.   Respiratory: Effort normal.  GI: Soft.  Gravid uterus c/w dates  Genitourinary:  Cervix 4-5 cm/80%/-1 midposition vertex, BOW bulging Cerclage palpable on the patient's right side, does NOT encircle the cervical opening,   We will remove the cerclage shortly.  Neurological: She is alert and oriented to person, place, and time.  Skin: Skin is warm and dry.    Prenatal labs: ABO, Rh: --/--/A POS (01/31 0810) Antibody: NEG (01/31 0810) Rubella: Immune (10/02 0000) RPR: NON REACTIVE (01/25 0732)  HBsAg: Negative (10/02 0000)  HIV: NON REACTIVE (01/25 0732)  GBS: Unknown (03/14 0000)   Assessment/Plan: Advanced Preterm Labor, 31+3 weeks Hx Mersilene cerclage,may have pulled thru the cervix, or be non-palpable in remaining cervical tissues Hx cervical incompetence Plan: Continue Mag sulfate Convert Obs to admit Admit to L&D   Dionicia Cerritos V 02/20/2013, 1:46 AM

## 2013-02-20 NOTE — Consult Note (Signed)
Asked to provide prenatal consultation for 32 y.o. G5 P1-1-2-1 (previous baby born via C/S at 24 wks did not survive)  who is now 86 3/[redacted] weeks EGA, with pregnancy complicated by cervical incompetence with cerclage (removed last night) and preterm labor.  Now at 5 - 6 cm dilated, anticipate vaginal delivery. She has been treated with betamethasone and MgSO4 for neuroprophylaxis.  Discussed usual expectations for preterm infant at [redacted] weeks gestation, including possible needs for DR resuscitation, respiratory support, IV access. Projected possible length of stay in NICU until 36 - [redacted] wks EGA.  Discussed advantages of feeding with mother's milk, she plans to pump post delivery.  Patient was attentive, had appropriate questions, and was appreciative of my input.  Thank you for the consultation.

## 2013-02-20 NOTE — Progress Notes (Signed)
Epidural catheter in place. Dr Sheral Apley in to bolus and hook up to pump

## 2013-02-20 NOTE — Consult Note (Signed)
Neonatology Note:  Attendance at Code Apgar:  Our team responded to a Code Apgar call to room # 162 following NSVD at 31 3/7 weeks, due to infant with apnea. The mother is a G5P2A2L1 A pos, GBS neg with onset of preterm labor. She has a history of PTSD and "explosive personality disorder". She has gotten 2 doses of betamethasone. She had a cerclage that was removed this morning and was on magnesium sulfate for about 6 hours this morning. ROM occurred just prior to delivery and the fluid was clear. At delivery, the baby was slow to breathe and had a HR that dropped to 60. The OB nursing staff in attendance gave vigorous stimulation and a Code Apgar was called. They gave some PPV, also Our team arrived at 3 minutes of life, at which time the baby was breathing irregularly with the HR undulating between normal and 80-90, depending on breathing. We bulb suctioned, then placed the neopuff on him and gave a few PPV breaths to bring the HR up. After that, we continued the neopuff and he maintained HR and color on about 40% FIO2 and PEEP of 6. Ap 5/8. I spoke with the mother in the DR, she saw the baby briefly, and we transported him to the NICU on the neopuff with his grandmother in attendance.  Tiffany Najarro C. Tiffany Nolton, MD  

## 2013-02-20 NOTE — Brief Op Note (Signed)
02/20/2013  4:56 AM  PATIENT:  Tiffany Floyd  32 y.o. female  PRE-OPERATIVE DIAGNOSIS:  cerclage in place, advanced preterm labor POST-OPERATIVE DIAGNOSIS:  cerclage in place, advanced preterm labor  PROCEDURE:  Procedure(s):  Removal of cervical cerclage (N/A) SURGEON:  Surgeon(s) and Role:    * Tilda Burrow, MD - Primary  PHYSICIAN ASSISTANT:  ASSISTANTS: none  ANESTHESIA:   epidural and spinal  EBL:  Total I/O In: 670.8 [I.V.:670.8] Out: 200 [Urine:200] BLOOD ADMINISTERED:none DRAINS: none   LOCAL MEDICATIONS USED:  NONE SPECIMEN:  No Specimen  DISPOSITION OF SPECIMEN:  N/A COUNTS:  YES TOURNIQUET:  * No tourniquets in log *  DICTATION: .Dragon Dictation Patient was taken to the operating room, spinal and epidural combined technique used, with spinal analgesia achieved. Timeout was conducted The patient had good analgesic effect. Speculum was inserted. The Prolene suture that could only be identified with difficulty on bedside speculum exam can now be well visualized. It did not encircled the remaining cervix, but was protruding from the posterior right lateral cervicovaginal fornix. This can now be transected and extracted. Cervix was inspected and was not showing any further bleeding. Membranes remained intact, though they've membrane visible in the center of the open dilated cervix thickened. Digital exam showed the cervix to be 5 cm dilated 70% effaced, -2 midposition cervix, with vertex presentation confirmed digitally patient allowed to go recovery room and will be transferred to labor and delivery. Magnesium sulfate for narrow prophylaxis will be continued at 2 g per hour, contractions and progression toward delivery are likely. Group B strep is negative so antibiotics were not initiated. Patient has received Ancef perioperatively 2 g IV  PLAN OF CARE: Admit to inpatient  PATIENT DISPOSITION:  PACU - hemodynamically stable.   Delay start of Pharmacological VTE agent  (>24hrs) due to surgical blood loss or risk of bleeding: not applicable

## 2013-02-20 NOTE — Progress Notes (Signed)
Ur chart review completed.  

## 2013-02-20 NOTE — Plan of Care (Signed)
Problem: Consults Goal: Birthing Suites Patient Information Press F2 to bring up selections list Outcome: Completed/Met Date Met:  02/20/13  Pt < [redacted] weeks EGA

## 2013-02-20 NOTE — Progress Notes (Signed)
Tiffany Floyd is a 32 y.o. [phone] at [redacted]w[redacted]d by ultrasound admitted for Preterm labor, with mersilene cerclage in place.  Subjective:  Unable to adequately expose mersilene cerclage in bedside speculum effort x2.  Moderate bleeding from base of cerclage knot prevents adequate visibility at attempt to remove.  Patient will require OR removal. Last solid food 2PM yesterday, last solids 9 pm. Procedure reviewed with patient including risk for further bleeding, SROM, or incomplete removal.  Patient understands and agrees to cerclage removal Case discussed with anesthesia, OR notified. Objective: BP 100/57  Pulse 103  Temp(Src) 97.9 F (36.6 C) (Oral)  Resp 24  Ht 5\' 3"  (1.6 m)  Wt 77.565 kg (171 lb)  BMI 30.3 kg/m2  LMP 05/17/2012   Total I/O In: 170.8 [I.V.:170.8] Out: -   FHT:  FHR: 145 bpm, variability: moderate,  accelerations:  Present,  decelerations:  Absent UC:   irregular, every 5-8 minutes SVE:   Dilation: 4.5 Effacement (%): 80 Station: -1 Exam by:: Dr Emelda Fear  Labs: Lab Results  Component Value Date   WBC 12.0* 02/20/2013   HGB 10.8* 02/20/2013   HCT 32.3* 02/20/2013   MCV 89.2 02/20/2013   PLT 200 02/20/2013    Assessment / Plan: preterm labor 31+ weeks, Cerclage in place to go to OR for removal  Labor:  Preeclampsia:   Fetal Wellbeing:  Category I Pain Control:  Epidural planned possible combined approach, for cerclage removal I/D:   Anticipated MOD:  NSVD  Askia Hazelip V 02/20/2013, 3:58 AM

## 2013-02-20 NOTE — Anesthesia Postprocedure Evaluation (Signed)
  Anesthesia Post-op Note  Patient: Tiffany Floyd  Procedure(s) Performed: Procedure(s):  Removal of cervical cerclage (N/A)   Patient is awake, responsive, not moving her legs, and will go directly to L&D to labor Pain and nausea are reasonably well controlled. Vital signs are stable and clinically acceptable. Oxygen saturation is clinically acceptable. There are no apparent anesthetic complications at this time. Patient is ready for discharge.

## 2013-02-20 NOTE — Progress Notes (Signed)
Patient ID: Tiffany Floyd, female   DOB: 1981-03-20, 32 y.o.   MRN: [id number]  This is a 32 y.o. female at [redacted]w[redacted]d weeks who is here for preterm labor. Her cerclage was removed last night.   She is on Magnesium Sulfate for neuropropholaxis.  FHR is reassuring UCs are every 5-6 minutes  Cervix was checked by RN:  Dilation: 5 Effacement (%): 80 Cervical Position: Middle Station: +1 Presentation: Vertex Exam by:: Frankey Poot, RN  Discussed with DR Marice Potter. Will keep Magnesium Sulfate going until tomorrow morning unless patient delivers

## 2013-02-20 NOTE — Anesthesia Procedure Notes (Signed)
Spinal  Patient location during procedure: OR Preanesthetic Checklist Completed: patient identified, site marked, surgical consent, pre-op evaluation, timeout performed, IV checked, risks and benefits discussed and monitors and equipment checked Spinal Block Patient position: sitting Prep: DuraPrep Patient monitoring: cardiac monitor, continuous pulse ox, blood pressure and heart rate Approach: midline Location: L3-4 Injection technique: catheter Needle Needle type: Tuohy and Sprotte  Needle gauge: 24 G Needle length: 12.7 cm Needle insertion depth: 6 cm Catheter type: closed end flexible Catheter size: 19 g Catheter at skin depth: 12 cm Assessment Sensory level: T8 Additional Notes (-) asp CSF or heme from catheter.

## 2013-02-21 ENCOUNTER — Encounter (HOSPITAL_COMMUNITY): Payer: Self-pay | Admitting: Anesthesiology

## 2013-02-21 ENCOUNTER — Inpatient Hospital Stay (HOSPITAL_COMMUNITY): Payer: Medicaid Other | Admitting: Anesthesiology

## 2013-02-21 ENCOUNTER — Encounter (HOSPITAL_COMMUNITY)
Admission: AD | Disposition: A | Discharge status: Home / Self Care | Payer: Self-pay | Source: Ambulatory Visit | Attending: Obstetrics and Gynecology

## 2013-02-21 ENCOUNTER — Encounter: Payer: Self-pay | Admitting: *Deleted

## 2013-02-21 ENCOUNTER — Encounter (HOSPITAL_COMMUNITY): Payer: Self-pay | Admitting: *Deleted

## 2013-02-21 DIAGNOSIS — Z302 Encounter for sterilization: Secondary | ICD-10-CM

## 2013-02-21 HISTORY — PX: TUBAL LIGATION: SHX77

## 2013-02-21 LAB — CBC
MCHC: 33.3 g/dL (ref 30.0–36.0)
Platelets: 177 10*3/uL (ref 150–400)
RDW: 13.5 % (ref 11.5–15.5)

## 2013-02-21 LAB — SURGICAL PCR SCREEN
MRSA, PCR: NEGATIVE
Staphylococcus aureus: NEGATIVE

## 2013-02-21 SURGERY — LIGATION, FALLOPIAN TUBE, POSTPARTUM
Anesthesia: Epidural | Site: Abdomen | Laterality: Bilateral | Wound class: Clean

## 2013-02-21 MED ORDER — LIDOCAINE-EPINEPHRINE (PF) 2 %-1:200000 IJ SOLN
INTRAMUSCULAR | Status: AC
  Filled 2013-02-21: qty 20

## 2013-02-21 MED ORDER — LIDOCAINE-EPINEPHRINE (PF) 2 %-1:200000 IJ SOLN
INTRAMUSCULAR | Status: DC | PRN
  Administered 2013-02-21: 5 mL via EPIDURAL

## 2013-02-21 MED ORDER — FENTANYL CITRATE 0.05 MG/ML IJ SOLN
INTRAMUSCULAR | Status: AC
  Filled 2013-02-21: qty 2

## 2013-02-21 MED ORDER — BUPIVACAINE HCL 0.5 % IJ SOLN
INTRAMUSCULAR | Status: DC | PRN
  Administered 2013-02-21: 20 mL

## 2013-02-21 MED ORDER — ONDANSETRON HCL 4 MG/2ML IJ SOLN
INTRAMUSCULAR | Status: DC | PRN
  Administered 2013-02-21: 4 mg via INTRAVENOUS

## 2013-02-21 MED ORDER — LACTATED RINGERS IV SOLN
INTRAVENOUS | Status: DC | PRN
  Administered 2013-02-21 (×2): via INTRAVENOUS

## 2013-02-21 MED ORDER — FENTANYL CITRATE 0.05 MG/ML IJ SOLN
INTRAMUSCULAR | Status: DC | PRN
  Administered 2013-02-21: 100 ug via INTRAVENOUS

## 2013-02-21 MED ORDER — 0.9 % SODIUM CHLORIDE (POUR BTL) OPTIME
TOPICAL | Status: DC | PRN
  Administered 2013-02-21: 1000 mL

## 2013-02-21 MED ORDER — SODIUM BICARBONATE 8.4 % IV SOLN
INTRAVENOUS | Status: AC
  Filled 2013-02-21: qty 50

## 2013-02-21 MED ORDER — MIDAZOLAM HCL 5 MG/5ML IJ SOLN
INTRAMUSCULAR | Status: DC | PRN
  Administered 2013-02-21: 2 mg via INTRAVENOUS

## 2013-02-21 MED ORDER — KETOROLAC TROMETHAMINE 30 MG/ML IJ SOLN
INTRAMUSCULAR | Status: AC
  Filled 2013-02-21: qty 1

## 2013-02-21 MED ORDER — ONDANSETRON HCL 4 MG/2ML IJ SOLN
INTRAMUSCULAR | Status: AC
  Filled 2013-02-21: qty 2

## 2013-02-21 MED ORDER — MIDAZOLAM HCL 2 MG/2ML IJ SOLN
INTRAMUSCULAR | Status: AC
  Filled 2013-02-21: qty 2

## 2013-02-21 MED ORDER — DEXAMETHASONE SODIUM PHOSPHATE 10 MG/ML IJ SOLN
INTRAMUSCULAR | Status: DC | PRN
  Administered 2013-02-21: 10 mg via INTRAVENOUS

## 2013-02-21 MED ORDER — BUPIVACAINE HCL (PF) 0.5 % IJ SOLN
INTRAMUSCULAR | Status: AC
  Filled 2013-02-21: qty 30

## 2013-02-21 MED ORDER — DEXAMETHASONE SODIUM PHOSPHATE 10 MG/ML IJ SOLN
INTRAMUSCULAR | Status: AC
  Filled 2013-02-21: qty 1

## 2013-02-21 SURGICAL SUPPLY — 21 items
BENZOIN TINCTURE PRP APPL 2/3 (GAUZE/BANDAGES/DRESSINGS) ×2 IMPLANT
CATH ROBINSON RED A/P 16FR (CATHETERS) ×2 IMPLANT
CHLORAPREP W/TINT 26ML (MISCELLANEOUS) ×2 IMPLANT
CLOTH BEACON ORANGE TIMEOUT ST (SAFETY) ×2 IMPLANT
GLOVE ECLIPSE 7.0 STRL STRAW (GLOVE) ×2 IMPLANT
GLOVE INDICATOR 7.0 STRL GRN (GLOVE) ×4 IMPLANT
GOWN PREVENTION PLUS LG XLONG (DISPOSABLE) ×2 IMPLANT
GOWN STRL REIN XL XLG (GOWN DISPOSABLE) ×2 IMPLANT
NEEDLE HYPO 25X1 1.5 SAFETY (NEEDLE) IMPLANT
NS IRRIG 1000ML POUR BTL (IV SOLUTION) ×2 IMPLANT
PACK ABDOMINAL MINOR (CUSTOM PROCEDURE TRAY) ×2 IMPLANT
SPONGE GAUZE 2X2 8PLY STRL LF (GAUZE/BANDAGES/DRESSINGS) ×2 IMPLANT
STRIP CLOSURE SKIN 1/2X4 (GAUZE/BANDAGES/DRESSINGS) ×2 IMPLANT
SUT VIC AB 0 CT1 27 (SUTURE) ×1
SUT VIC AB 0 CT1 27XBRD ANBCTR (SUTURE) ×1 IMPLANT
SUT VIC AB 3-0 SH 18 (SUTURE) ×2 IMPLANT
SUT VIC AB 4-0 PS2 27 (SUTURE) ×2 IMPLANT
SYR CONTROL 10ML LL (SYRINGE) IMPLANT
TAPE CLOTH SURG 4X10 WHT LF (GAUZE/BANDAGES/DRESSINGS) ×2 IMPLANT
TOWEL OR 17X24 6PK STRL BLUE (TOWEL DISPOSABLE) ×4 IMPLANT
WATER STERILE IRR 1000ML POUR (IV SOLUTION) ×2 IMPLANT

## 2013-02-21 NOTE — Anesthesia Postprocedure Evaluation (Signed)
  Anesthesia Post-op Note  Patient: Tiffany Floyd  Procedure(s) Performed: Procedure(s): POST PARTUM TUBAL LIGATION (Bilateral)  Anesthesia Post Note  Patient: Tiffany Floyd  Procedure(s) Performed: Procedure(s) (LRB): POST PARTUM TUBAL LIGATION (Bilateral)  Anesthesia type: Epidural  Patient location: Mother/Baby  Post pain: Pain level controlled  Post assessment: Post-op Vital signs reviewed  Last Vitals:  Filed Vitals:   02/21/13 1300  BP: 113/62  Pulse: 93  Temp:   Resp: 16    Post vital signs: Reviewed  Level of consciousness:alert  Complications: No apparent anesthesia complications

## 2013-02-21 NOTE — Addendum Note (Signed)
Addendum created 02/21/13 1045 by Eusebio Friendly., MD   Modules edited: Anesthesia Responsible Staff

## 2013-02-21 NOTE — H&P (Signed)
  Patient desires surgical management with post partum sterilization.  The risks of surgery were discussed in detail with the patient including but not limited to: bleeding which may require transfusion or reoperation; infection which may require prolonged hospitalization and antibiotic therapy; injury to bowel, bladder, ureters and major vessels or other surrounding organs; need for additional procedures including laparotomy; thromboembolic phenomenon, incisional problems and other postoperative or anesthesia complications.  Failure rate 3-03/999. All questions were answered.  Will proceed with postpartum BTL will attempt bilateral partial salpingectomy vs Filshie clips.  Patient aware that in the post partum state the mesosalpinx can be quite vascular which would prohibit a salpingectomy.

## 2013-02-21 NOTE — Anesthesia Postprocedure Evaluation (Signed)
  Anesthesia Post Note  Patient: Tiffany Floyd  Procedure(s) Performed: Procedure(s) (LRB): POST PARTUM TUBAL LIGATION (Bilateral)  Anesthesia type: Epidural  Patient location: PACU  Post pain: Pain level controlled  Post assessment: Post-op Vital signs reviewed  Last Vitals:  Filed Vitals:   02/21/13 1030  BP: 115/56  Pulse: 83  Temp:   Resp: 17    Post vital signs: Reviewed  Level of consciousness: awake  Complications: No apparent anesthesia complications

## 2013-02-21 NOTE — Anesthesia Preprocedure Evaluation (Signed)
Anesthesia Evaluation  Patient identified by MRN, date of birth, ID band Patient awake    Reviewed: Allergy & Precautions, H&P , Patient's Chart, lab work & pertinent test results  Airway Mallampati: II TM Distance: >3 FB Neck ROM: full    Dental no notable dental hx.    Pulmonary  breath sounds clear to auscultation  Pulmonary exam normal       Cardiovascular Exercise Tolerance: Good Rhythm:regular Rate:Normal     Neuro/Psych    GI/Hepatic   Endo/Other    Renal/GU      Musculoskeletal   Abdominal   Peds  Hematology   Anesthesia Other Findings   Reproductive/Obstetrics                           Anesthesia Physical Anesthesia Plan  ASA: II  Anesthesia Plan: Spinal and Epidural   Post-op Pain Management:    Induction:   Airway Management Planned:   Additional Equipment:   Intra-op Plan:   Post-operative Plan:   Informed Consent: I have reviewed the patients History and Physical, chart, labs and discussed the procedure including the risks, benefits and alternatives for the proposed anesthesia with the patient or authorized representative who has indicated his/her understanding and acceptance.   Dental Advisory Given  Plan Discussed with: CRNA  Anesthesia Plan Comments: (Lab work confirmed with CRNA in room. Platelets okay. Discussed spinal anesthetic, and patient consents to the procedure:  included risk of possible headache,backache, failed block, allergic reaction, and nerve injury. This patient was asked if she had any questions or concerns before the procedure started. )        Anesthesia Quick Evaluation  

## 2013-02-21 NOTE — Progress Notes (Signed)
Pre-op report called to D. Harvell, RN in Short Stay Unit.

## 2013-02-21 NOTE — Progress Notes (Signed)
Post Partum Day 1 Subjective: no complaints, up ad lib, voiding, tolerating PO and + flatus NPO since MN. Planning BTL today  Objective: Blood pressure 116/79, pulse 88, temperature 97.9 F (36.6 C), temperature source Oral, resp. rate 16, height 5\' 3"  (1.6 m), weight 77.565 kg (171 lb), last menstrual period 05/17/2012, SpO2 99.00%.  Physical Exam:  General: alert and no distress Lochia: appropriate Uterine Fundus: firm Incision: NA DVT Evaluation: No evidence of DVT seen on physical exam.   Recent Labs  02/20/13 0237 02/21/13 0540  HGB 10.8* 7.8*  HCT 32.3* 23.4*    Assessment/Plan: Plan for discharge tomorrow, Breastfeeding, Lactation consult and Contraception BTL today   LOS: 1 day   Tawnya Crook 02/21/2013, 7:41 AM

## 2013-02-21 NOTE — Op Note (Signed)
Tiffany Floyd 02/20/2013 - 02/21/2013  PREOPERATIVE DIAGNOSIS:  Multiparity, undesired fertility  POSTOPERATIVE DIAGNOSIS:  Multiparity, undesired fertility  PROCEDURE:  Postpartum Bilateral Tubal Sterilization with partial salpingectomy    ANESTHESIA:  Epidural and local analgesia using 0.5% Marcaine  COMPLICATIONS:  None immediate.   INDICATIONS: 32 y.o. [id number]  with undesired fertility,status post vaginal delivery, desires permanent sterilization.  Other reversible forms of contraception were discussed with patient; she declines all other modalities. Risks of procedure discussed with patient including but not limited to: risk of regret, permanence of method, bleeding, infection, injury to surrounding organs and need for additional procedures.  Failure risk of 0.5-1% with increased risk of ectopic gestation if pregnancy occurs was also discussed with patient.     FINDINGS:  Normal uterus, tubes, and ovaries.  PROCEDURE DETAILS: The patient was taken to the operating room where her epidural anesthesia was dosed up to surgical level and found to be adequate.  She was then placed in the dorsal supine position and prepped and draped in sterile fashion.  After an adequate timeout was performed, attention was turned to the patient's abdomen where a small transverse skin incision was made under the umbilical fold. The incision was taken down to the layer of fascia using the scalpel, and fascia was incised, and extended bilaterally using Mayo scissors. The peritoneum was entered in a sharp fashion. Attention was then turned to the patient's uterus, and right fallopian tube was identified and followed out to the fimbriated end.  A kelly clamp was placed across the fimbriated end of the fallopian tube to about 4 cm from the cornual attachment, with care given to incorporate the underlying mesosalpinx.  A free tie of 3-0 vicryl was used to ligate the tube followed by a suture of 3-0 vicryl. A similar  process was carried out on the left side allowing for bilateral tubal sterilization.  Good hemostasis was noted overall.  The instruments were then removed from the patient's abdomen and the fascial incision was repaired with 0 Vicryl, and the skin was closed with a 4-0 Vicryl subcuticular stitch. The patient tolerated the procedure well.  Instrument, sponge, and needle counts were correct times two.  The patient was then taken to the recovery room awake and in stable condition.

## 2013-02-21 NOTE — Transfer of Care (Signed)
Immediate Anesthesia Transfer of Care Note  Patient: Tiffany Floyd  Procedure(s) Performed: Procedure(s): POST PARTUM TUBAL LIGATION (Bilateral)  Patient Location: PACU  Anesthesia Type:Epidural  Level of Consciousness: sedated  Airway & Oxygen Therapy: Patient Spontanous Breathing  Post-op Assessment: Report given to PACU RN  Post vital signs: Reviewed and stable  Complications: No apparent anesthesia complications

## 2013-02-21 NOTE — Anesthesia Postprocedure Evaluation (Signed)
Anesthesia Post Note  Patient: Tiffany Floyd  Procedure(s) Performed: * No procedures listed *  Anesthesia type: Epidural  Patient location: Mother/Baby  Post pain: Pain level controlled  Post assessment: Post-op Vital signs reviewed  Last Vitals: BP 100/51  Pulse 88  Temp(Src) 36.7 C (Oral)  Resp 18  Ht 5\' 3"  (1.6 m)  Wt 171 lb (77.565 kg)  BMI 30.3 kg/m2  SpO2 100%  LMP 05/17/2012  Post vital signs: Reviewed  Level of consciousness: awake  Complications: No apparent anesthesia complications

## 2013-02-21 NOTE — Brief Op Note (Signed)
02/20/2013 - 02/21/2013  10:15 AM  PATIENT:  Oretha Milch  32 y.o. female  PRE-OPERATIVE DIAGNOSIS:  desires sterilization  POST-OPERATIVE DIAGNOSIS:  desires sterilization   PROCEDURE:  Procedure(s): POST PARTUM TUBAL LIGATION (Bilateral)  SURGEON:  Surgeon(s) and Role:    * Willodean Rosenthal, MD - Primary  ANESTHESIA:   epidural  EBL:  Total I/O In: 1800 [I.V.:1800] Out: 355 [Urine:350; Blood:5]  BLOOD ADMINISTERED:none  DRAINS: none   LOCAL MEDICATIONS USED:  MARCAINE     SPECIMEN:  Source of Specimen:  partial fallopian tubes bilaterally  DISPOSITION OF SPECIMEN:  PATHOLOGY  COUNTS:  YES  TOURNIQUET:  * No tourniquets in log *  DICTATION: .Note written in EPIC  PLAN OF CARE: patient is already an inpatient  PATIENT DISPOSITION:  PACU - hemodynamically stable.   Delay start of Pharmacological VTE agent (>24hrs) due to surgical blood loss or risk of bleeding: yes

## 2013-02-22 ENCOUNTER — Encounter (HOSPITAL_COMMUNITY): Payer: Self-pay | Admitting: Obstetrics & Gynecology

## 2013-02-22 MED ORDER — IBUPROFEN 600 MG PO TABS: 600.0000 mg | ORAL_TABLET | Freq: Four times a day (QID) | ORAL | Status: DC

## 2013-02-22 MED ORDER — OXYCODONE-ACETAMINOPHEN 5-325 MG PO TABS: 1.0000 | ORAL_TABLET | ORAL | Status: DC | PRN

## 2013-02-22 NOTE — Progress Notes (Signed)
Pt teaching complete   States she had  tdap in January  And does not need it

## 2013-02-22 NOTE — Discharge Summary (Signed)
Obstetric Discharge Summary Reason for Admission: onset of preterm labor Prenatal Procedures: cerclage Intrapartum Procedures: spontaneous vaginal delivery and removal of cerclage while laboring- VBAC Postpartum Procedures: P.P. tubal ligation Complications-Operative and Postpartum: none Hemoglobin  Date Value Range Status  02/21/2013 7.8* 12.0 - 15.0 g/dL Final     REPEATED TO VERIFY     DELTA CHECK NOTED  09/28/2012 11.2   Final     HCT  Date Value Range Status  02/21/2013 23.4* 36.0 - 46.0 % Final  09/28/2012 34   Final   Tiffany Floyd presented on 02/19/13 at 31.3wks [id number] with cx dilated 4+ cm and cerclage still in place. Was removed by Dr Emelda Fear in the OR and pt was treated with BMZ and mag sulfate for neuroprophylaxis. Pt continued to labor and progressed to delivery later the same afternoon.   Physical Exam:  General: alert, cooperative and mild distress Lungs: nl effort Heart: RRR Lochia: appropriate Uterine Fundus: firm Incision: healing well, dsg marked/unchanged DVT Evaluation: No evidence of DVT seen on physical exam.  Discharge Diagnoses: Premature labor and delivery; PP Tubal Ligation- successful VBAC  Discharge Information: Date: 02/22/2013 Activity: pelvic rest Diet: routine Medications: PNV, Ibuprofen and Percocet Condition: stable Instructions: refer to practice specific booklet Discharge to: home Follow-up Information   Follow up with [facility]. (Make a postpartum appointment for 4-6 weeks)    Contact information:   [address] [place] [address]       Newborn Data: Live born female  Birth Weight: 3 lb 9.5 oz (1630 g) APGAR: 5, 8  Extended NICU stay. Pt pumping breasts.  Cam Hai 02/22/2013, 7:29 AM

## 2013-02-23 NOTE — Clinical Social Work Maternal (Signed)
Clinical Social Work Department PSYCHOSOCIAL ASSESSMENT - MATERNAL/CHILD 02/23/2013  Patient:  Tiffany Floyd, Tiffany Floyd  Account Number:  [id number]  Admit Date:  02/20/2013  Marjo Bicker Name:   Herbie Drape    Clinical Social Worker:  Lulu Riding, LCSW   Date/Time:  02/22/2013 10:30 AM  Date Referred:  02/22/2013   Referral source  NICU     Referred reason  NICU   Other referral source:    I:  FAMILY / HOME ENVIRONMENT Child's legal guardian:  PARENT  Guardian - Name Guardian - Age Guardian - Address  Tiffany Floyd [address], Tiffany Floyd, Tiffany Floyd, Tiffany Floyd  Tiffany Floyd 30 incarcerated   Other household support members/support persons Name Relationship DOB  Tiffany Floyd 3/06   Other support:   MOB's parents are supportive and here with her today.    II  PSYCHOSOCIAL DATA Information Source:  Family Interview  Event organiser Employment:   Financial resources:  [employer] If [employer] - County:  [employer] / Grade:   Maternity Care Coordinator / Child Services Coordination / Early Interventions:  Cultural issues impacting care:   None identified    III  STRENGTHS Strengths  Adequate Resources  Compliance with medical plan  Supportive family/friends   Strength comment:    IV  RISK FACTORS AND CURRENT PROBLEMS Current Problem:  YES   Risk Factor & Current Problem Patient Issue Family Issue Risk Factor / Current Problem Comment  Mental Illness Y N MOB-Explosive Personality Disorder, PTSD, Depression   N N     V  SOCIAL WORK ASSESSMENT CSW met with MOB in her third floor room/316 to introduce myself, complete assessment and evaluate how she is coping with baby's premature birth and admission to NICU.  MOB states she and baby are fine.  CSW had difficulty completing assessment because MOB was very short with CSW and would not discuss her hx with CSW.  CSW asked about her mental health diagnoses and she said she is fine and that  she has a Veterinary surgeon at [employer].  CSW asked MOB if she feels comfortable talking about her loss last year in regards to emotions that may likely errupt due to that hx. She told CSW that this is completely different and that baby is doing well.  She states she did not seek grief counseling or support in the community after that loss. CSW discussed signs and symptoms of PPD and gave Feelings After Birth handout, but MOB told CSW that she knows about that.  MOB states she has questions about Medicaid.  CSW explained that since she has pregnancy Medicaid, the baby will have it automatically and that she will need to contact her worker.  She told CSW that it was completed in the hospital for her last baby.  CSW explained that she may be talking about SSI and that this baby does not qualify. CSW asked about FOB and if he is involved and supportive and she became more defensive and asked if she had to give CSW information about him.  CSW told her know and the reason CSW is asking is that CSW wants to know about her support system.  She states FOB is in [facility] jail and that they are together and she has contact with him. He is the father of her child who passed last year as well. She does not know when he will be getting out of jail.  MOB states she has an 49 year old daughter at  home.  She states they have some items for the baby, but need a bed for the baby.  CSW will make referral to [employer]. CSW is concerned about MOB's mental health diagnoses and her unwillingness to address them.  CSW will continue to monitor the situation and appreciates detailed documentation regarding MOB's visits with baby.      VI SOCIAL WORK PLAN Social Work Plan  Psychosocial Support/Ongoing Assessment of Needs   Type of pt/family education:   PPD signs and symptoms   If child protective services report - county:   If child protective services report - date:   Information/referral to community resources  comment:   CSW will continue to evaluate need for community resources. No referral made at initial assessment.   Other social work plan:

## 2013-02-23 NOTE — Addendum Note (Signed)
Addendum created 02/23/13 1309 by Eusebio Friendly., MD   Modules edited: Anesthesia Events

## 2013-02-27 ENCOUNTER — Ambulatory Visit (HOSPITAL_COMMUNITY): Payer: MEDICAID

## 2013-02-28 ENCOUNTER — Encounter (HOSPITAL_COMMUNITY): Payer: Self-pay | Admitting: Anesthesiology

## 2013-02-28 ENCOUNTER — Inpatient Hospital Stay (HOSPITAL_COMMUNITY): Payer: Medicaid Other

## 2013-02-28 ENCOUNTER — Inpatient Hospital Stay (HOSPITAL_COMMUNITY): Payer: Medicaid Other | Admitting: Anesthesiology

## 2013-02-28 ENCOUNTER — Encounter (HOSPITAL_COMMUNITY): Payer: Self-pay | Admitting: *Deleted

## 2013-02-28 ENCOUNTER — Ambulatory Visit: Admit: 2013-02-28 | Payer: Self-pay | Admitting: Obstetrics & Gynecology

## 2013-02-28 ENCOUNTER — Encounter (HOSPITAL_COMMUNITY)
Admission: AD | Disposition: A | Discharge status: Home / Self Care | Payer: Self-pay | Source: Ambulatory Visit | Attending: Obstetrics & Gynecology

## 2013-02-28 ENCOUNTER — Inpatient Hospital Stay (HOSPITAL_COMMUNITY)
Admission: AD | Admit: 2013-02-28 | Discharge: 2013-03-02 | DRG: 769 | Disposition: A | Discharge status: Home / Self Care | Payer: Medicaid Other | Source: Ambulatory Visit | Attending: Obstetrics & Gynecology | Admitting: Obstetrics & Gynecology

## 2013-02-28 DIAGNOSIS — O9081 Anemia of the puerperium: Secondary | ICD-10-CM | POA: Diagnosis present

## 2013-02-28 DIAGNOSIS — O99345 Other mental disorders complicating the puerperium: Secondary | ICD-10-CM | POA: Diagnosis present

## 2013-02-28 DIAGNOSIS — F411 Generalized anxiety disorder: Secondary | ICD-10-CM | POA: Diagnosis present

## 2013-02-28 DIAGNOSIS — D649 Anemia, unspecified: Secondary | ICD-10-CM | POA: Diagnosis present

## 2013-02-28 HISTORY — PX: DILATION AND EVACUATION: SHX1459

## 2013-02-28 LAB — CBC
HCT: 21 % — ABNORMAL LOW (ref 36.0–46.0)
MCH: 29.5 pg (ref 26.0–34.0)
MCHC: 32.3 g/dL (ref 30.0–36.0)
MCHC: 32.9 g/dL (ref 30.0–36.0)
MCV: 89.7 fL (ref 78.0–100.0)
Platelets: 369 10*3/uL (ref 150–400)
Platelets: 411 10*3/uL — ABNORMAL HIGH (ref 150–400)
RBC: 2.34 MIL/uL — ABNORMAL LOW (ref 3.87–5.11)
RBC: 2.88 MIL/uL — ABNORMAL LOW (ref 3.87–5.11)
RDW: 13.4 % (ref 11.5–15.5)
RDW: 13.6 % (ref 11.5–15.5)
WBC: 13.4 10*3/uL — ABNORMAL HIGH (ref 4.0–10.5)

## 2013-02-28 SURGERY — DILATION AND EVACUATION, UTERUS
Anesthesia: Monitor Anesthesia Care | Site: Vagina | Wound class: Clean Contaminated

## 2013-02-28 MED ORDER — DOXYCYCLINE HYCLATE 100 MG IV SOLR
200.0000 mg | Freq: Once | INTRAVENOUS | Status: AC
  Administered 2013-02-28: 200 mg via INTRAVENOUS
  Filled 2013-02-28: qty 200

## 2013-02-28 MED ORDER — LIDOCAINE HCL (CARDIAC) 20 MG/ML IV SOLN
INTRAVENOUS | Status: DC | PRN
  Administered 2013-02-28 (×2): 10 mg via INTRAVENOUS

## 2013-02-28 MED ORDER — FENTANYL CITRATE 0.05 MG/ML IJ SOLN: 25.0000 ug | INTRAMUSCULAR | Status: DC | PRN

## 2013-02-28 MED ORDER — MISOPROSTOL 200 MCG PO TABS
ORAL_TABLET | ORAL | Status: AC
  Filled 2013-02-28: qty 4

## 2013-02-28 MED ORDER — OXYCODONE-ACETAMINOPHEN 5-325 MG PO TABS
ORAL_TABLET | ORAL | Status: AC
  Administered 2013-02-28: 1
  Filled 2013-02-28: qty 1

## 2013-02-28 MED ORDER — BUPIVACAINE HCL 0.25 % IJ SOLN
INTRAMUSCULAR | Status: DC | PRN
  Administered 2013-02-28: 10 mL

## 2013-02-28 MED ORDER — LACTATED RINGERS IV SOLN: INTRAVENOUS | Status: DC

## 2013-02-28 MED ORDER — PROPOFOL INFUSION 10 MG/ML OPTIME
INTRAVENOUS | Status: DC | PRN
  Administered 2013-02-28 (×6): 30 mL via INTRAVENOUS

## 2013-02-28 MED ORDER — OXYTOCIN 10 UNIT/ML IJ SOLN
INTRAMUSCULAR | Status: DC | PRN
  Administered 2013-02-28: 10 [IU] via INTRAMUSCULAR

## 2013-02-28 MED ORDER — KETOROLAC TROMETHAMINE 30 MG/ML IJ SOLN: 15.0000 mg | Freq: Once | INTRAMUSCULAR | Status: AC | PRN

## 2013-02-28 MED ORDER — MISOPROSTOL 100 MCG PO TABS
ORAL_TABLET | ORAL | Status: DC | PRN
  Administered 2013-02-28: 800 ug

## 2013-02-28 MED ORDER — BUPIVACAINE HCL (PF) 0.25 % IJ SOLN
INTRAMUSCULAR | Status: AC
  Filled 2013-02-28: qty 30

## 2013-02-28 MED ORDER — METHYLERGONOVINE MALEATE 0.2 MG PO TABS
0.2000 mg | ORAL_TABLET | Freq: Once | ORAL | Status: AC
  Administered 2013-02-28: 0.2 mg via ORAL
  Filled 2013-02-28: qty 1

## 2013-02-28 MED ORDER — FENTANYL CITRATE 0.05 MG/ML IJ SOLN
INTRAMUSCULAR | Status: DC | PRN
  Administered 2013-02-28 (×3): 50 ug via INTRAVENOUS
  Administered 2013-02-28 (×2): 25 ug via INTRAVENOUS

## 2013-02-28 MED ORDER — OXYCODONE-ACETAMINOPHEN 5-325 MG PO TABS
1.0000 | ORAL_TABLET | ORAL | Status: DC | PRN
  Administered 2013-03-01: 1 via ORAL
  Filled 2013-02-28 (×2): qty 2
  Filled 2013-02-28: qty 1

## 2013-02-28 MED ORDER — PROPOFOL 10 MG/ML IV EMUL
INTRAVENOUS | Status: DC | PRN
  Administered 2013-02-28: 30 mg via INTRAVENOUS
  Administered 2013-02-28: 20 mg via INTRAVENOUS
  Administered 2013-02-28 (×2): 30 mg via INTRAVENOUS
  Administered 2013-02-28 (×3): 20 mg via INTRAVENOUS
  Administered 2013-02-28: 30 mg via INTRAVENOUS

## 2013-02-28 MED ORDER — MIDAZOLAM HCL 5 MG/5ML IJ SOLN
INTRAMUSCULAR | Status: DC | PRN
  Administered 2013-02-28 (×2): 1 mg via INTRAVENOUS

## 2013-02-28 MED ORDER — ONDANSETRON HCL 4 MG/2ML IJ SOLN
INTRAMUSCULAR | Status: DC | PRN
  Administered 2013-02-28: 4 mg via INTRAVENOUS

## 2013-02-28 MED ORDER — LACTATED RINGERS IV SOLN
INTRAVENOUS | Status: DC
  Administered 2013-02-28 (×4): via INTRAVENOUS

## 2013-02-28 MED ORDER — FAMOTIDINE IN NACL 20-0.9 MG/50ML-% IV SOLN
20.0000 mg | Freq: Once | INTRAVENOUS | Status: AC
  Administered 2013-02-28: 20 mg via INTRAVENOUS
  Filled 2013-02-28: qty 50

## 2013-02-28 MED ORDER — METHYLERGONOVINE MALEATE 0.2 MG PO TABS
ORAL_TABLET | ORAL | Status: AC
  Administered 2013-02-28: 0.2 mg
  Filled 2013-02-28: qty 1

## 2013-02-28 MED ORDER — METHYLERGONOVINE MALEATE 0.2 MG PO TABS: 0.2000 mg | ORAL_TABLET | Freq: Three times a day (TID) | ORAL | Status: DC

## 2013-02-28 SURGICAL SUPPLY — 20 items
CATH ROBINSON RED A/P 16FR (CATHETERS) ×2 IMPLANT
CLOTH BEACON ORANGE TIMEOUT ST (SAFETY) ×2 IMPLANT
DECANTER SPIKE VIAL GLASS SM (MISCELLANEOUS) IMPLANT
GLOVE BIO SURGEON STRL SZ 6.5 (GLOVE) ×2 IMPLANT
GLOVE BIOGEL PI IND STRL 7.0 (GLOVE) ×1 IMPLANT
GLOVE BIOGEL PI INDICATOR 7.0 (GLOVE) ×1
GOWN STRL REIN XL XLG (GOWN DISPOSABLE) ×4 IMPLANT
KIT BERKELEY 1ST TRIMESTER 3/8 (MISCELLANEOUS) IMPLANT
NEEDLE SPNL 22GX3.5 QUINCKE BK (NEEDLE) IMPLANT
PACK VAGINAL MINOR WOMEN LF (CUSTOM PROCEDURE TRAY) ×2 IMPLANT
PAD OB MATERNITY 4.3X12.25 (PERSONAL CARE ITEMS) ×2 IMPLANT
PAD PREP 24X48 CUFFED NSTRL (MISCELLANEOUS) ×2 IMPLANT
SET BERKELEY SUCTION TUBING (SUCTIONS) IMPLANT
SYR CONTROL 10ML LL (SYRINGE) IMPLANT
TOWEL OR 17X24 6PK STRL BLUE (TOWEL DISPOSABLE) ×4 IMPLANT
VACURETTE 10 RIGID CVD (CANNULA) IMPLANT
VACURETTE 7MM CVD STRL WRAP (CANNULA) IMPLANT
VACURETTE 8 RIGID CVD (CANNULA) IMPLANT
VACURETTE 9 RIGID CVD (CANNULA) IMPLANT
WATER STERILE IRR 1000ML POUR (IV SOLUTION) ×2 IMPLANT

## 2013-02-28 NOTE — Anesthesia Postprocedure Evaluation (Signed)
  Anesthesia Post-op Note  Anesthesia Post Note  Patient: Tiffany Floyd  Procedure(s) Performed: Procedure(s) (LRB): DILATATION AND EVACUATION (N/A)  Anesthesia type: MAC  Patient location: PACU  Post pain: Pain level controlled  Post assessment: Post-op Vital signs reviewed  Last Vitals:  Filed Vitals:   02/28/13 2145  BP: 115/75  Pulse: 89  Temp:   Resp: 27    Post vital signs: Reviewed  Level of consciousness: sedated  Complications: No apparent anesthesia complications

## 2013-02-28 NOTE — Op Note (Signed)
Procedure: Suction dilation and curettage Preoperative diagnosis: Retained products of conception with hemorrhage 1 week postpartum Postoperative diagnosis: Same Surgeon: Dr. Scheryl Darter Anesthesia: MAC and intracervical block with local anesthetic Estimated blood loss: 100 mL Specimen products of conception Complications: None Drains: None Counts: Correct  Patient gave written consent for suction dilation and curettage. She presented one week postpartum with vaginal bleeding and ultrasound findings consistent with retained products of conception. Patient identification was confirmed and she was brought to the operating room. She Placed under MAC anesthesia and placed in dorsal lithotomy position. Perineum and vagina were sterilely prepped and draped. Bladder was drained with red rubber catheter. Exam revealed approximately 8-10 week size uterus and cervix was open. There were clots in the vagina. Speculum was inserted. Quarter percent Marcaine was infiltrated for intracervical block. Cervix was grasped with single-tooth tenaculum. Cervix was sufficiently dilated to allow the suction curette to be passed. 10 mm curette was used. Suction curettage was performed and products of conception were obtained. Complete evacuation of the uterine cavity was assured. Patient received IV Pitocin and Cytotec 800 mcg per rectum in her bleeding was minimal at the time but she left the operating room. Brought in stable condition to PACU.  ARNOLD,JAMES 02/28/2013 8:34 PM

## 2013-02-28 NOTE — Progress Notes (Signed)
Patient up in recliner waiting on ride soaked peripad. Bright red blood dripping in toilet. Patient state's can't go home bleeding like this. Dr. Debroah Loop called will see patient.

## 2013-02-28 NOTE — MAU Note (Addendum)
After pumping, stood up and had a gush of blood, passed some clots. Baby is in the NICU.  Delivered vaginally on Mon 02/24.

## 2013-02-28 NOTE — H&P (Signed)
CSN: [id number]  Arrival date and time: 02/28/13 1516  First Provider Initiated Contact with Patient 02/28/13 1705  Chief Complaint   Patient presents with   .  Vaginal Bleeding    HPI  This is a 32 y.o. female who is about a week postpartum from a preterm delivery at 31 weeks. Delivery was uneventful and baby is in NICU. Pt was pumping and stood up to feel blood gushing out. Has had normal lochia since delivery With no heavy bleeding.  OB History    Grav  Para  Term  Preterm  Abortions  TAB  SAB  Ect  Mult  Living    5  3  1  2  2  1  1   0  0  2      Past Medical History   Diagnosis  Date   .  Post traumatic stress disorder (PTSD)    .  Explosive personality disorder     Past Surgical History   Procedure  Laterality  Date   .  Cesarean section   03/10/2012     Procedure: CESAREAN SECTION; Surgeon: Tereso Newcomer, MD; Location: [facility]; Service: Gynecology; Laterality: N/A; Primary Cesarean Section Delivery Boy @ 2344,   .  Cervical cerclage   12/02/2012     Procedure: CERCLAGE CERVICAL; Surgeon: Tereso Newcomer, MD; Location: [facility]; Service: Gynecology; Laterality: N/A;   .  Cervical cerclage  N/A  02/20/2013     Procedure: Removal of cervical cerclage; Surgeon: Tilda Burrow, MD; Location: [facility]; Service: Gynecology; Laterality: N/A;   .  Tubal ligation  Bilateral  02/21/2013     Procedure: POST PARTUM TUBAL LIGATION; Surgeon: Willodean Rosenthal, MD; Location: [facility]; Service: Gynecology; Laterality: Bilateral;    Family History   Problem  Relation  Age of Onset   .  Anesthesia problems  Neg Hx     History   Substance Use Topics   .  Smoking status:  Never Smoker   .  Smokeless tobacco:  Never Used   .  Alcohol Use:  No    Allergies: No Known Allergies  Prescriptions prior to admission   Medication  Sig  Dispense  Refill   .  ibuprofen (ADVIL,MOTRIN) 600 MG tablet  Take 600 mg by mouth as needed for pain.     Marland Kitchen  oxyCODONE-acetaminophen (PERCOCET/ROXICET) 5-325 MG per  tablet  Take 1-2 tablets by mouth every 4 (four) hours as needed for pain.     .  [DISCONTINUED] ibuprofen (ADVIL,MOTRIN) 600 MG tablet  Take 1 tablet (600 mg total) by mouth every 6 (six) hours.  50 tablet  1   .  [DISCONTINUED] oxyCODONE-acetaminophen (PERCOCET/ROXICET) 5-325 MG per tablet  Take 1-2 tablets by mouth every 4 (four) hours as needed.  30 tablet  0    Review of Systems  Constitutional: Negative for fever, chills and malaise/fatigue.  Cardiovascular: Negative for chest pain.  Gastrointestinal: Negative for nausea, vomiting and abdominal pain.  Genitourinary:  Heavy vaginal bleeding  Neurological: Positive for dizziness (occasional). Negative for weakness.   Physical Exam   Blood pressure 111/57, pulse 71, temperature 97.7 F (36.5 C), temperature source Oral, resp. rate 18, SpO2 98.00%, currently breastfeeding.  Physical Exam  Constitutional: She is oriented to person, place, and time. She appears well-developed and well-nourished. No distress.  HENT:  Head: Normocephalic.  Cardiovascular: Normal rate.  Respiratory: Effort normal.  GI: Soft. She exhibits no distension and no mass. There is tenderness (  over uterus). There is no rebound and no guarding.  Genitourinary: Uterus normal. Vaginal discharge found.  Bimanual: Soft uterus Clots in cervix, heavy flow but not active Difficult to do a complete exam due to discomfort  Musculoskeletal: Normal range of motion.  Neurological: She is alert and oriented to person, place, and time.  Skin: Skin is warm and dry.  Psychiatric: She has a normal mood and affect.   Results for orders placed during the hospital encounter of 02/28/13 (from the past 24 hour(s))   CBC Status: Abnormal    Collection Time    02/28/13 4:05 PM   Result  Value  Range    WBC  13.4 (*)  4.0 - 10.5 K/uL    RBC  2.88 (*)  3.87 - 5.11 MIL/uL    Hemoglobin  8.4 (*)  12.0 - 15.0 g/dL    HCT  16.1 (*)  09.6 - 46.0 %    MCV  90.3  78.0 - 100.0 fL    MCH   29.2  26.0 - 34.0 pg    MCHC  32.3  30.0 - 36.0 g/dL    RDW  04.5  40.9 - 81.1 %    Platelets  411 (*)  150 - 400 K/uL    Hgb at discharge was 7.8  MAU Course   Procedures  *RADIOLOGY REPORT*  Clinical Data: 32 year old female status post vaginal delivery on  02/20/2013. New onset heavy bleeding. Question retained products  of conception.  TRANSVAGINAL ULTRASOUND OF PELVIS  Technique: Transvaginal ultrasound examination of the pelvis was  performed including evaluation of the uterus, ovaries, adnexal  regions, and pelvic cul-de-sac.  Comparison: 02/13/2013.  Findings:  Uterus: Enlarged. 10.4 x 10.3 x 9.5 cm.  Endometrium: Enlarged endometrial canal containing a complex  heterogeneous components, some of which show vascularity on power  Doppler (image 21), and some which do not. Abnormality continues  to the level of the cervix.  Other Findings: No free fluididentified.  IMPRESSION:  Constellation of findings highly suspicious for retained products  of conception in the endometrial canal combined with the hematoma.  Study discussed with the patient's midwife Hilda Lias by phone at the  time of this dictation.  Original Report Authenticated By: Erskine Speed, M.D.  MDM  Will check [place] for retained POC. Had one episode of dizziness when stood up. Heart rate normal however.  US showed retained products of conception.  Assessment and Plan   A: Postpartum x 1 week of premature infant  Retained placental fragments  P: Discussed with Dr Debroah Loop. Suction D&C discussed and the risks of anesthesia, bleeding, infection, pelvic organ damage were discussed and her questions were answered.  Plan D&C  Type and Screen  [facility]  02/28/2013, 5:38 PM  ARNOLD,JAMES 02/28/2013 10:14 PM

## 2013-02-28 NOTE — Anesthesia Preprocedure Evaluation (Addendum)
Anesthesia Evaluation  Patient identified by MRN, date of birth, ID band Patient awake    Reviewed: Allergy & Precautions, H&P , NPO status , Patient's Chart, lab work & pertinent test results, reviewed documented beta blocker date and time   History of Anesthesia Complications Negative for: history of anesthetic complications  Airway Mallampati: III TM Distance: >3 FB Neck ROM: full    Dental  (+) Teeth Intact   Pulmonary neg pulmonary ROS,  breath sounds clear to auscultation        Cardiovascular negative cardio ROS  Rhythm:regular Rate:Normal     Neuro/Psych PSYCHIATRIC DISORDERS (PTSD, explosive personality d/o) negative neurological ROS     GI/Hepatic negative GI ROS, Neg liver ROS,   Endo/Other  negative endocrine ROS  Renal/GU negative Renal ROS  negative genitourinary   Musculoskeletal   Abdominal   Peds  Hematology  (+) anemia ,   Anesthesia Other Findings NPO since 10 am (full meal)  Reproductive/Obstetrics (+) Pregnancy (s/p SVD 2/24, now with retained products)                           Anesthesia Physical Anesthesia Plan  ASA: II  Anesthesia Plan: MAC   Post-op Pain Management:    Induction:   Airway Management Planned:   Additional Equipment:   Intra-op Plan:   Post-operative Plan:   Informed Consent: I have reviewed the patients History and Physical, chart, labs and discussed the procedure including the risks, benefits and alternatives for the proposed anesthesia with the patient or authorized representative who has indicated his/her understanding and acceptance.     Plan Discussed with: Surgeon and CRNA  Anesthesia Plan Comments:         Anesthesia Quick Evaluation

## 2013-02-28 NOTE — Discharge Summary (Signed)
Attestation of Attending Supervision of Advanced Practitioner (CNM/NP): Evaluation and management procedures were performed by the Advanced Practitioner under my supervision and collaboration.  I have reviewed the Advanced Practitioner's note and chart, and I agree with the management and plan.  HARRAWAY-SMITH, Letticia Bhattacharyya 2:32 PM     

## 2013-02-28 NOTE — MAU Note (Signed)
2 unsuccessful IV attempts by J. Rana Snare RN.  Yvonna Alanis RN @ bedside to start IV.

## 2013-02-28 NOTE — Transfer of Care (Addendum)
Immediate Anesthesia Transfer of Care Note  Patient: Tiffany Floyd  Procedure(s) Performed: Procedure(s): DILATATION AND EVACUATION (N/A)  Patient Location: PACU  Anesthesia Type:MAC  Level of Consciousness: awake, alert , oriented and patient cooperative  Airway & Oxygen Therapy: Patient Spontanous Breathing and Patient connected to nasal cannula oxygen  Post-op Assessment: Report given to PACU RN and Post -op Vital signs reviewed and stable  Post vital signs: Reviewed and stable  Complications: No apparent anesthesia complications

## 2013-02-28 NOTE — MAU Provider Note (Signed)
History     CSN: [id number]  Arrival date and time: 02/28/13 1516   First Provider Initiated Contact with Patient 02/28/13 1705      Chief Complaint  Patient presents with  . Vaginal Bleeding   HPI This is a 32 y.o. female who is about a week postpartum from a preterm delivery at 31 weeks. Delivery was uneventful and baby is in NICU. Pt was pumping and stood up to feel blood gushing out. Has had normal lochia since delivery  With no heavy bleeding.   OB History   Grav Para Term Preterm Abortions TAB SAB Ect Mult Living   5 3 1 2 2 1 1  0 0 2      Past Medical History  Diagnosis Date  . Post traumatic stress disorder (PTSD)   . Explosive personality disorder     Past Surgical History  Procedure Laterality Date  . Cesarean section  03/10/2012    Procedure: CESAREAN SECTION;  Surgeon: Tereso Newcomer, MD;  Location: [facility];  Service: Gynecology;  Laterality: N/A;  Primary Cesarean Section Delivery Boy @ 2344,  . Cervical cerclage  12/02/2012    Procedure: CERCLAGE CERVICAL;  Surgeon: Tereso Newcomer, MD;  Location: [facility];  Service: Gynecology;  Laterality: N/A;  . Cervical cerclage N/A 02/20/2013    Procedure:  Removal of cervical cerclage;  Surgeon: Tilda Burrow, MD;  Location: [facility];  Service: Gynecology;  Laterality: N/A;  . Tubal ligation Bilateral 02/21/2013    Procedure: POST PARTUM TUBAL LIGATION;  Surgeon: Willodean Rosenthal, MD;  Location: [facility];  Service: Gynecology;  Laterality: Bilateral;    Family History  Problem Relation Age of Onset  . Anesthesia problems Neg Hx     History  Substance Use Topics  . Smoking status: Never Smoker   . Smokeless tobacco: Never Used  . Alcohol Use: No    Allergies: No Known Allergies  Prescriptions prior to admission  Medication Sig Dispense Refill  . ibuprofen (ADVIL,MOTRIN) 600 MG tablet Take 600 mg by mouth as needed for pain.      Marland Kitchen oxyCODONE-acetaminophen (PERCOCET/ROXICET) 5-325 MG per tablet Take 1-2  tablets by mouth every 4 (four) hours as needed for pain.      . [DISCONTINUED] ibuprofen (ADVIL,MOTRIN) 600 MG tablet Take 1 tablet (600 mg total) by mouth every 6 (six) hours.  50 tablet  1  . [DISCONTINUED] oxyCODONE-acetaminophen (PERCOCET/ROXICET) 5-325 MG per tablet Take 1-2 tablets by mouth every 4 (four) hours as needed.  30 tablet  0    Review of Systems  Constitutional: Negative for fever, chills and malaise/fatigue.  Cardiovascular: Negative for chest pain.  Gastrointestinal: Negative for nausea, vomiting and abdominal pain.  Genitourinary:       Heavy vaginal bleeding  Neurological: Positive for dizziness (occasional). Negative for weakness.   Physical Exam   Blood pressure 111/57, pulse 71, temperature 97.7 F (36.5 C), temperature source Oral, resp. rate 18, SpO2 98.00%, currently breastfeeding.  Physical Exam  Constitutional: She is oriented to person, place, and time. She appears well-developed and well-nourished. No distress.  HENT:  Head: Normocephalic.  Cardiovascular: Normal rate.   Respiratory: Effort normal.  GI: Soft. She exhibits no distension and no mass. There is tenderness (over uterus). There is no rebound and no guarding.  Genitourinary: Uterus normal. Vaginal discharge found.  Bimanual:  Soft uterus   Clots in cervix, heavy flow but not active Difficult to do a complete exam due to discomfort  Musculoskeletal: Normal  range of motion.  Neurological: She is alert and oriented to person, place, and time.  Skin: Skin is warm and dry.  Psychiatric: She has a normal mood and affect.   Results for orders placed during the hospital encounter of 02/28/13 (from the past 24 hour(s))  CBC     Status: Abnormal   Collection Time    02/28/13  4:05 PM      Result Value Range   WBC 13.4 (*) 4.0 - 10.5 K/uL   RBC 2.88 (*) 3.87 - 5.11 MIL/uL   Hemoglobin 8.4 (*) 12.0 - 15.0 g/dL   HCT 47.8 (*) 29.5 - 62.1 %   MCV 90.3  78.0 - 100.0 fL   MCH 29.2  26.0 - 34.0 pg    MCHC 32.3  30.0 - 36.0 g/dL   RDW 30.8  65.7 - 84.6 %   Platelets 411 (*) 150 - 400 K/uL   Hgb at discharge was 7.8  MAU Course  Procedures *RADIOLOGY REPORT*  Clinical Data: 32 year old female status post vaginal delivery on  02/20/2013. New onset heavy bleeding. Question retained products  of conception.  TRANSVAGINAL ULTRASOUND OF PELVIS  Technique: Transvaginal ultrasound examination of the pelvis was  performed including evaluation of the uterus, ovaries, adnexal  regions, and pelvic cul-de-sac.  Comparison: 02/13/2013.  Findings:  Uterus: Enlarged. 10.4 x 10.3 x 9.5 cm.  Endometrium: Enlarged endometrial canal containing a complex  heterogeneous components, some of which show vascularity on power  Doppler (image 21), and some which do not. Abnormality continues  to the level of the cervix.  Other Findings: No free fluididentified.  IMPRESSION:  Constellation of findings highly suspicious for retained products  of conception in the endometrial canal combined with the hematoma.  Study discussed with the patient's midwife Hilda Lias by phone at the  time of this dictation.  Original Report Authenticated By: Erskine Speed, M.D.  MDM Will check [place] for retained POC.  Had one episode of dizziness when stood up. Heart rate normal however.   US showed retained products of conception.    Assessment and Plan  A:  Postpartum x 1 week of premature infant       Retained placental fragments  P:  Discussed with Dr Debroah Loop. Suction D&C discussed and the risks of anesthesia, bleeding, infection, pelvic organ damage were discussed and her questions were answered.        Plan D&C       Type and Screen  [facility] 02/28/2013, 5:38 PM

## 2013-03-01 ENCOUNTER — Encounter (HOSPITAL_COMMUNITY): Payer: Self-pay | Admitting: Anesthesiology

## 2013-03-01 ENCOUNTER — Observation Stay (HOSPITAL_COMMUNITY): Payer: Medicaid Other

## 2013-03-01 ENCOUNTER — Observation Stay (HOSPITAL_COMMUNITY): Payer: Medicaid Other | Admitting: Anesthesiology

## 2013-03-01 ENCOUNTER — Encounter (HOSPITAL_COMMUNITY)
Admission: AD | Disposition: A | Discharge status: Home / Self Care | Payer: Self-pay | Source: Ambulatory Visit | Attending: Obstetrics & Gynecology

## 2013-03-01 HISTORY — PX: EXAMINATION UNDER ANESTHESIA: SHX1540

## 2013-03-01 HISTORY — PX: DILATION AND EVACUATION: SHX1459

## 2013-03-01 LAB — PROTIME-INR
INR: 1.02 (ref 0.00–1.49)
Prothrombin Time: 13.3 seconds (ref 11.6–15.2)
Prothrombin Time: 13.7 seconds (ref 11.6–15.2)

## 2013-03-01 LAB — POSTPARTUM HEMORRHAGE PROTOCOL (BB NOTIFICATION)

## 2013-03-01 LAB — CBC
MCHC: 34.9 g/dL (ref 30.0–36.0)
MCV: 83.3 fL (ref 78.0–100.0)
RDW: 14.6 % (ref 11.5–15.5)

## 2013-03-01 LAB — SAVE SMEAR

## 2013-03-01 LAB — FIBRINOGEN: Fibrinogen: 321 mg/dL (ref 204–475)

## 2013-03-01 SURGERY — EXAM UNDER ANESTHESIA
Anesthesia: Monitor Anesthesia Care | Site: Vagina | Wound class: Clean

## 2013-03-01 MED ORDER — CARBOPROST TROMETHAMINE 250 MCG/ML IM SOLN
INTRAMUSCULAR | Status: AC
  Administered 2013-03-01: 250 ug via INTRAMUSCULAR
  Filled 2013-03-01: qty 1

## 2013-03-01 MED ORDER — CARBOPROST TROMETHAMINE 250 MCG/ML IM SOLN: 250.0000 ug | INTRAMUSCULAR | Status: DC | PRN

## 2013-03-01 MED ORDER — KETOROLAC TROMETHAMINE 30 MG/ML IJ SOLN: 30.0000 mg | Freq: Four times a day (QID) | INTRAMUSCULAR | Status: DC

## 2013-03-01 MED ORDER — METHYLERGONOVINE MALEATE 0.2 MG/ML IJ SOLN: 0.2000 mg | Freq: Once | INTRAMUSCULAR | Status: DC

## 2013-03-01 MED ORDER — FENTANYL CITRATE 0.05 MG/ML IJ SOLN
INTRAMUSCULAR | Status: AC
  Filled 2013-03-01: qty 2

## 2013-03-01 MED ORDER — OXYCODONE HCL 5 MG PO TABS
5.0000 mg | ORAL_TABLET | Freq: Once | ORAL | Status: AC
  Administered 2013-03-01: 5 mg via ORAL
  Filled 2013-03-01: qty 1

## 2013-03-01 MED ORDER — KETOROLAC TROMETHAMINE 30 MG/ML IJ SOLN
30.0000 mg | Freq: Four times a day (QID) | INTRAMUSCULAR | Status: DC
  Administered 2013-03-01: 30 mg via INTRAVENOUS
  Filled 2013-03-01: qty 1

## 2013-03-01 MED ORDER — TEMAZEPAM 15 MG PO CAPS
15.0000 mg | ORAL_CAPSULE | Freq: Every evening | ORAL | Status: DC | PRN
  Administered 2013-03-01: 15 mg via ORAL
  Filled 2013-03-01: qty 1

## 2013-03-01 MED ORDER — IBUPROFEN 400 MG PO TABS: 400.0000 mg | ORAL_TABLET | Freq: Four times a day (QID) | ORAL | Status: DC | PRN

## 2013-03-01 MED ORDER — LACTATED RINGERS IV SOLN
INTRAVENOUS | Status: DC | PRN
  Administered 2013-03-01 (×2): via INTRAVENOUS

## 2013-03-01 MED ORDER — MIDAZOLAM HCL 2 MG/2ML IJ SOLN
INTRAMUSCULAR | Status: AC
  Filled 2013-03-01: qty 2

## 2013-03-01 MED ORDER — FENTANYL CITRATE 0.05 MG/ML IJ SOLN: 25.0000 ug | INTRAMUSCULAR | Status: DC | PRN

## 2013-03-01 MED ORDER — LACTATED RINGERS IV SOLN
40.0000 [IU] | INTRAVENOUS | Status: DC | PRN
  Administered 2013-03-01: 40 [IU] via INTRAVENOUS

## 2013-03-01 MED ORDER — SODIUM CHLORIDE 0.9 % IJ SOLN
3.0000 mL | Freq: Two times a day (BID) | INTRAMUSCULAR | Status: DC
  Administered 2013-03-02: 3 mL via INTRAVENOUS

## 2013-03-01 MED ORDER — LACTATED RINGERS IV SOLN
INTRAVENOUS | Status: DC | PRN
  Administered 2013-03-01: 08:00:00 via INTRAVENOUS

## 2013-03-01 MED ORDER — LACTATED RINGERS IV SOLN: INTRAVENOUS | Status: DC

## 2013-03-01 MED ORDER — DIPHENOXYLATE-ATROPINE 2.5-0.025 MG PO TABS
2.0000 | ORAL_TABLET | Freq: Four times a day (QID) | ORAL | Status: DC | PRN
  Filled 2013-03-01: qty 2

## 2013-03-01 MED ORDER — SODIUM CHLORIDE 0.9 % IJ SOLN: 3.0000 mL | INTRAMUSCULAR | Status: DC | PRN

## 2013-03-01 MED ORDER — OXYTOCIN 40 UNITS IN LACTATED RINGERS INFUSION - SIMPLE MED
250.0000 mL/h | INTRAVENOUS | Status: DC
  Administered 2013-03-01: 40 [IU] via INTRAVENOUS

## 2013-03-01 MED ORDER — FENTANYL CITRATE 0.05 MG/ML IJ SOLN
INTRAMUSCULAR | Status: DC | PRN
  Administered 2013-03-01: 100 ug via INTRAVENOUS

## 2013-03-01 MED ORDER — OXYTOCIN 40 UNITS IN LACTATED RINGERS INFUSION - SIMPLE MED
INTRAVENOUS | Status: AC
  Filled 2013-03-01: qty 1000

## 2013-03-01 MED ORDER — MISOPROSTOL 200 MCG PO TABS
1000.0000 ug | ORAL_TABLET | Freq: Once | ORAL | Status: AC
  Administered 2013-03-01: 1000 ug via RECTAL

## 2013-03-01 MED ORDER — METOCLOPRAMIDE HCL 5 MG/ML IJ SOLN
INTRAMUSCULAR | Status: DC | PRN
  Administered 2013-03-01: 10 mg via INTRAVENOUS

## 2013-03-01 MED ORDER — ONDANSETRON HCL 4 MG/2ML IJ SOLN: 4.0000 mg | Freq: Four times a day (QID) | INTRAMUSCULAR | Status: DC | PRN

## 2013-03-01 MED ORDER — CARBOPROST TROMETHAMINE 250 MCG/ML IM SOLN
INTRAMUSCULAR | Status: DC | PRN
  Administered 2013-03-01: 250 ug via INTRAMUSCULAR

## 2013-03-01 MED ORDER — ACETAMINOPHEN 10 MG/ML IV SOLN: 1000.0000 mg | Freq: Once | INTRAVENOUS | Status: DC | PRN

## 2013-03-01 MED ORDER — ONDANSETRON HCL 4 MG PO TABS: 4.0000 mg | ORAL_TABLET | Freq: Four times a day (QID) | ORAL | Status: DC | PRN

## 2013-03-01 MED ORDER — MISOPROSTOL 200 MCG PO TABS: 800.0000 ug | ORAL_TABLET | Freq: Once | ORAL | Status: DC

## 2013-03-01 MED ORDER — BUPIVACAINE HCL (PF) 0.25 % IJ SOLN
INTRAMUSCULAR | Status: DC | PRN
  Administered 2013-03-01: 10 mL

## 2013-03-01 MED ORDER — DOXYCYCLINE HYCLATE 100 MG PO TABS
100.0000 mg | ORAL_TABLET | Freq: Two times a day (BID) | ORAL | Status: DC
  Administered 2013-03-01 – 2013-03-02 (×3): 100 mg via ORAL
  Filled 2013-03-01 (×5): qty 1

## 2013-03-01 MED ORDER — PROPOFOL 10 MG/ML IV EMUL
INTRAVENOUS | Status: DC | PRN
  Administered 2013-03-01 (×8): 20 mg via INTRAVENOUS
  Administered 2013-03-01: 30 mg via INTRAVENOUS
  Administered 2013-03-01 (×4): 20 mg via INTRAVENOUS

## 2013-03-01 MED ORDER — FENTANYL CITRATE 0.05 MG/ML IJ SOLN
INTRAMUSCULAR | Status: AC
  Administered 2013-03-01: 100 ug via INTRAVENOUS
  Filled 2013-03-01: qty 2

## 2013-03-01 MED ORDER — SODIUM CHLORIDE 0.9 % IV SOLN: 250.0000 mL | INTRAVENOUS | Status: DC | PRN

## 2013-03-01 MED ORDER — SODIUM CHLORIDE 0.9 % IV SOLN
INTRAVENOUS | Status: DC | PRN
  Administered 2013-03-01: 07:00:00 via INTRAVENOUS

## 2013-03-01 MED ORDER — OXYTOCIN 40 UNITS IN LACTATED RINGERS INFUSION - SIMPLE MED: 250.0000 mL/h | INTRAVENOUS | Status: DC

## 2013-03-01 MED ORDER — METHYLERGONOVINE MALEATE 0.2 MG/ML IJ SOLN
INTRAMUSCULAR | Status: AC
  Administered 2013-03-01: 0.2 mg via INTRAMUSCULAR
  Filled 2013-03-01: qty 1

## 2013-03-01 MED ORDER — MIDAZOLAM HCL 5 MG/5ML IJ SOLN
INTRAMUSCULAR | Status: DC | PRN
  Administered 2013-03-01: 2 mg via INTRAVENOUS

## 2013-03-01 MED ORDER — MISOPROSTOL 200 MCG PO TABS
ORAL_TABLET | ORAL | Status: AC
  Filled 2013-03-01: qty 5

## 2013-03-01 MED ORDER — MISOPROSTOL 100 MCG PO TABS
ORAL_TABLET | ORAL | Status: DC | PRN
  Administered 2013-03-01: 800 ug via RECTAL

## 2013-03-01 SURGICAL SUPPLY — 24 items
ADAPTER VACURETTE TBG SET 14 (CANNULA) ×3 IMPLANT
BLADE SURG 10 STRL SS (BLADE) IMPLANT
CATH ROBINSON RED A/P 16FR (CATHETERS) IMPLANT
CLOTH BEACON ORANGE TIMEOUT ST (SAFETY) ×3 IMPLANT
DECANTER SPIKE VIAL GLASS SM (MISCELLANEOUS) ×3 IMPLANT
DRESSING TELFA 8X3 (GAUZE/BANDAGES/DRESSINGS) ×3 IMPLANT
GLOVE BIO SURGEON STRL SZ 6.5 (GLOVE) ×9 IMPLANT
GLOVE BIOGEL PI IND STRL 7.0 (GLOVE) ×4 IMPLANT
GLOVE BIOGEL PI INDICATOR 7.0 (GLOVE) ×2
GLOVE INDICATOR 7.0 STRL GRN (GLOVE) ×6 IMPLANT
GOWN STRL NON-REIN LRG LVL3 (GOWN DISPOSABLE) ×3 IMPLANT
GOWN STRL REIN XL XLG (GOWN DISPOSABLE) ×6 IMPLANT
KIT BERKELEY 1ST TRIMESTER 3/8 (MISCELLANEOUS) ×6 IMPLANT
NEEDLE SPNL 22GX3.5 QUINCKE BK (NEEDLE) ×3 IMPLANT
PACK VAGINAL MINOR WOMEN LF (CUSTOM PROCEDURE TRAY) ×3 IMPLANT
PAD OB MATERNITY 4.3X12.25 (PERSONAL CARE ITEMS) ×3 IMPLANT
PAD PREP 24X48 CUFFED NSTRL (MISCELLANEOUS) ×9 IMPLANT
SET BERKELEY SUCTION TUBING (SUCTIONS) ×6 IMPLANT
SLEEVE SCD COMPRESS KNEE MED (MISCELLANEOUS) ×3 IMPLANT
SYR CONTROL 10ML LL (SYRINGE) ×3 IMPLANT
TOWEL OR 17X24 6PK STRL BLUE (TOWEL DISPOSABLE) ×12 IMPLANT
VACURETTE 10 RIGID CVD (CANNULA) ×3 IMPLANT
VACURETTE 9 RIGID CVD (CANNULA) IMPLANT
WATER STERILE IRR 1000ML POUR (IV SOLUTION) ×6 IMPLANT

## 2013-03-01 NOTE — Progress Notes (Signed)
peripad  changed large amount bleeding.

## 2013-03-01 NOTE — Transfer of Care (Signed)
Immediate Anesthesia Transfer of Care Note  Patient: Tiffany Floyd  Procedure(s) Performed: Procedure(s): EXAM UNDER ANESTHESIA (N/A) DILATATION AND EVACUATION with Ultrasound (N/A)  Patient Location: PACU  Anesthesia Type:MAC  Level of Consciousness: awake, alert  and oriented  Airway & Oxygen Therapy: Patient Spontanous Breathing and Patient connected to nasal cannula oxygen  Post-op Assessment: Report given to PACU RN and Post -op Vital signs reviewed and stable  Post vital signs: Reviewed and stable  Complications: No apparent anesthesia complications

## 2013-03-01 NOTE — Progress Notes (Signed)
I agree with the above and was involved in her plan of care. Cam Hai 8:46 AM 03/01/2013

## 2013-03-01 NOTE — Op Note (Signed)
Procedure: Examination under anesthesia, a repeat suction curettage Preoperative diagnosis: Postpartum bleeding after suction curettage 8 days postpartum Postop diagnosis: Retained products of conception Surgeon: Dr. Scheryl Darter Anesthesia: MAC and intracervical block Complications: Intraoperative blood loss Estimated blood loss 500 mL Drains: Foley catheter Specimen: Products of conception Counts: Correct   Patient gave written consent for exam under anesthesia possible curettage and possible that Bakri balloon placement. She was about 9 days postpartum and had a D&C about 10 hours earlier where products of conception were obtained and complete evacuation of the cavity was thought to been achieved. She continues to have bleeding and passing clots. She gave consent clear return to the OR for exam under anesthesia and possible curettage or placement of balloon. Patient identification was confirmed. His placed in dorsal lithotomy position. MAC anesthesia was induced. Foley catheter was in place. IV access was obtained by anesthesia. She was receiving packed red blood cells. Speculum was inserted and courts that Marcaine was infiltrated for intracervical block. Cervix was grasped with tenaculum. Try to place a will and a Bakri and could not be retained in the uterus. Proceeded with suction curettage. 14 mm curette was used and suction curettage obtaining products of conception. When the cavity seemed to be completely evacuated an ultrasound was performed which showed no more products of conception. She continued to have moderate bleeding. Instruments were removed. Placed 800 mcg of Cytotec per rectum but this was expelled with stool. At the end of the procedure I ordered 250 mcg of Hemabate IM. She received IV Pitocin during the procedure.   Tiffany Floyd,Tiffany Floyd 03/01/2013 8:23 AM

## 2013-03-01 NOTE — Anesthesia Postprocedure Evaluation (Signed)
  Anesthesia Post-op Note  Patient: Tiffany Floyd  Procedure(s) Performed: Procedure(s): DILATATION AND EVACUATION (N/A)  Patient Location: PACU and Women[facility] Unit  Anesthesia Type:MAC  Level of Consciousness: awake, alert  and oriented  Airway and Oxygen Therapy: Patient Spontanous Breathing  Post-op Pain: none  Post-op Assessment: Post-op Vital signs reviewed  Post-op Vital Signs: Reviewed and stable  Complications: No apparent anesthesia complications

## 2013-03-01 NOTE — Progress Notes (Signed)
Dr. Debroah Loop at bedside; aware of bleeding; orders given

## 2013-03-01 NOTE — Progress Notes (Signed)
Pt soaked through 2 pads in 2 hours.  Pt feels lightheaded. Vital signs stable. Dr notified and en route to assess pt.

## 2013-03-01 NOTE — Progress Notes (Signed)
Interim Note  S: Called to pt bedside by Rn because of heavy vaginal bleeding. Pt is feeling lightheaded, dizzy, and complaining of severe cramping. She just had a 5 mg percocet which did not help her pain. She has soaked 2 pads in 2 hours. She notes she is feeling anxious  O: Filed Vitals:   03/01/13 0132  BP: 102/56  Pulse: 86  Temp: 97.8 F (36.6 C)  Resp: 18   Gen: NAD, alert, cooperative with exam HEENT: NCAT Resp: non-labored Abd: tenderness with massage of fundus, fundus firm Ext: No edema Neuro: Alert and oriented, anxious affect  A/P 33 y/o [id number] POD #1 after D&C for retained POC and hemmorhage - still with moderate vaginal bleeding- Cytotec 1000 mcg PR, wait 30 minutes for pain control with additional pain meds - Cramping pain, 5 more mg oxycodone PO - Anxiety- Tenazepam already ordered PRN- give 15 mg now - Monitor for continued bleeding  Kevin Fenton. MD 306 am, 03/01/2013

## 2013-03-01 NOTE — Progress Notes (Signed)
Sequence of events: At 0540 Dr. Debroah Loop was notified of pts third soaked pad since admission at 0030 and vital signs.  Dr. Debroah Loop at bedside at 0547, SVE with manual exploration of uterus with large amount of clots expressed.  LR with pit hung, second IV obtained in preparation for blood admin. First unit of blood hung at [phone]. Speculum exam performed but unable to visualize cervix because of continued heavy vaginal bleeding and clots.  Foley inserted then Dr. Debroah Loop discussed plan of care with patient.  Surgical consent obtained. Pt transferred to stretcher and taken to OR at [phone].

## 2013-03-01 NOTE — Anesthesia Postprocedure Evaluation (Signed)
  Anesthesia Post-op Note  Patient: Tiffany Floyd  Procedure(s) Performed: Procedure(s): EXAM UNDER ANESTHESIA (N/A) DILATATION AND EVACUATION with Ultrasound (N/A)  Patient Location: PACU  Anesthesia Type:MAC  Level of Consciousness: awake, alert  and oriented  Airway and Oxygen Therapy: Patient Spontanous Breathing  Post-op Pain: none  Post-op Assessment: Post-op Vital signs reviewed, Patient's Cardiovascular Status Stable, Respiratory Function Stable, Patent Airway, No signs of Nausea or vomiting and Pain level controlled  Post-op Vital Signs: Reviewed and stable  Complications: No apparent anesthesia complications

## 2013-03-01 NOTE — Anesthesia Preprocedure Evaluation (Addendum)
Anesthesia Evaluation  Patient identified by MRN, date of birth, ID band Patient awake    Reviewed: Allergy & Precautions, H&P , NPO status , Patient's Chart, lab work & pertinent test results, reviewed documented beta blocker date and time   History of Anesthesia Complications Negative for: history of anesthetic complications  Airway Mallampati: III TM Distance: >3 FB Neck ROM: full    Dental  (+) Teeth Intact   Pulmonary neg pulmonary ROS,  breath sounds clear to auscultation        Cardiovascular negative cardio ROS  Rhythm:regular Rate:Normal     Neuro/Psych PSYCHIATRIC DISORDERS (PTSD, explosive personality d/o) negative neurological ROS     GI/Hepatic negative GI ROS, Neg liver ROS,   Endo/Other  negative endocrine ROS  Renal/GU negative Renal ROS  negative genitourinary   Musculoskeletal   Abdominal   Peds  Hematology  (+) Blood dyscrasia (PPH - first unit of blood hanging.  last hemoglobin 6.9 (post-op from D&E last night)), anemia ,   Anesthesia Other Findings Full stomach: Ate pizza at 1 am  Reproductive/Obstetrics (+) Pregnancy (s/p SVD 2/24, now with retained products)                         Anesthesia Physical  Anesthesia Plan  ASA: II and emergent  Anesthesia Plan: MAC   Post-op Pain Management:    Induction:   Airway Management Planned:   Additional Equipment:   Intra-op Plan:   Post-operative Plan:   Informed Consent: I have reviewed the patients History and Physical, chart, labs and discussed the procedure including the risks, benefits and alternatives for the proposed anesthesia with the patient or authorized representative who has indicated his/her understanding and acceptance.     Plan Discussed with: Surgeon and CRNA  Anesthesia Plan Comments:        Anesthesia Quick Evaluation

## 2013-03-02 ENCOUNTER — Encounter (HOSPITAL_COMMUNITY): Payer: Self-pay | Admitting: Obstetrics & Gynecology

## 2013-03-02 LAB — TYPE AND SCREEN
ABO/RH(D): A POS
Antibody Screen: NEGATIVE
Unit division: 0
Unit division: 0
Unit division: 0

## 2013-03-02 LAB — CBC WITH DIFFERENTIAL/PLATELET
Basophils Absolute: 0 10*3/uL (ref 0.0–0.1)
Basophils Relative: 0 % (ref 0–1)
Eosinophils Relative: 0 % (ref 0–5)
HCT: 21.1 % — ABNORMAL LOW (ref 36.0–46.0)
Lymphs Abs: 3.5 10*3/uL (ref 0.7–4.0)
MCHC: 35.1 g/dL (ref 30.0–36.0)
MCV: 83.7 fL (ref 78.0–100.0)
Monocytes Absolute: 1.4 10*3/uL — ABNORMAL HIGH (ref 0.1–1.0)
Monocytes Relative: 9 % (ref 3–12)
Neutro Abs: 11.3 10*3/uL — ABNORMAL HIGH (ref 1.7–7.7)
RBC: 2.52 MIL/uL — ABNORMAL LOW (ref 3.87–5.11)
RDW: 15.3 % (ref 11.5–15.5)

## 2013-03-02 MED ORDER — IBUPROFEN 400 MG PO TABS: 400.0000 mg | ORAL_TABLET | Freq: Four times a day (QID) | ORAL | Status: DC | PRN

## 2013-03-02 MED ORDER — FERROUS SULFATE 325 (65 FE) MG PO TABS: 325.0000 mg | ORAL_TABLET | Freq: Two times a day (BID) | ORAL | Status: DC

## 2013-03-02 MED ORDER — PRENATAL VITAMINS 0.8 MG PO TABS: 1.0000 | ORAL_TABLET | Freq: Every day | ORAL | Status: DC

## 2013-03-02 NOTE — Discharge Summary (Signed)
Discharge Summary  Patient ID: Tiffany Floyd  MRN: [id number]  DOB/AGE: June 21, 1981 31 y.o.   Admit date: 02/28/2013  Discharge date: 03/02/2013  Preoperative Diagnoses: Postpartum bleeding, retained products of conception  Procedures: D&E suction curettage x 2, transfusion of 4 units RBCs  Significant Diagnostic Studies: Hemoglobin on admission 6.9, up to 9.9 after transfusion and 7.4 on day discharge Results for orders placed during the hospital encounter of 02/28/13 (from the past 24 hour(s))  CBC     Status: Abnormal   Collection Time    03/01/13 11:58 AM      Result Value Range   WBC 28.2 (*) 4.0 - 10.5 K/uL   RBC 3.41 (*) 3.87 - 5.11 MIL/uL   Hemoglobin 9.9 (*) 12.0 - 15.0 g/dL   HCT 84.6 (*) 96.2 - 95.2 %   MCV 83.3  78.0 - 100.0 fL   MCH 29.0  26.0 - 34.0 pg   MCHC 34.9  30.0 - 36.0 g/dL   RDW 84.1  32.4 - 40.1 %   Platelets 219  150 - 400 K/uL  FIBRINOGEN     Status: None   Collection Time    03/01/13 11:58 AM      Result Value Range   Fibrinogen 321  204 - 475 mg/dL  PROTIME-INR     Status: None   Collection Time    03/01/13 11:58 AM      Result Value Range   Prothrombin Time 13.7  11.6 - 15.2 seconds   INR 1.06  0.00 - 1.49  APTT     Status: None   Collection Time    03/01/13 11:58 AM      Result Value Range   aPTT 27  24 - 37 seconds  SAVE SMEAR     Status: None   Collection Time    03/01/13 11:58 AM      Result Value Range   Smear Review SMEAR STAINED AND AVAILABLE FOR REVIEW    CBC WITH DIFFERENTIAL     Status: Abnormal   Collection Time    03/02/13  6:52 AM      Result Value Range   WBC 16.3 (*) 4.0 - 10.5 K/uL   RBC 2.52 (*) 3.87 - 5.11 MIL/uL   Hemoglobin 7.4 (*) 12.0 - 15.0 g/dL   HCT 02.7 (*) 25.3 - 66.4 %   MCV 83.7  78.0 - 100.0 fL   MCH 29.4  26.0 - 34.0 pg   MCHC 35.1  30.0 - 36.0 g/dL   RDW 40.3  47.4 - 25.9 %   Platelets 185  150 - 400 K/uL   Neutrophils Relative 69  43 - 77 %   Neutro Abs 11.3 (*) 1.7 - 7.7 K/uL   Lymphocytes  Relative 22  12 - 46 %   Lymphs Abs 3.5  0.7 - 4.0 K/uL   Monocytes Relative 9  3 - 12 %   Monocytes Absolute 1.4 (*) 0.1 - 1.0 K/uL   Eosinophils Relative 0  0 - 5 %   Eosinophils Absolute 0.1  0.0 - 0.7 K/uL   Basophils Relative 0  0 - 1 %   Basophils Absolute 0.0  0.0 - 0.1 K/uL     Hospital Course:  Tiffany Floyd is a 32 y.o. [id number] admitted on postpartum day 8 with delayed postpartum hemorrhage due to retained products of conception. She was taken for D&E on 02/28/13 and received 4 units of RBCs. On the evening following initial procedure, she had a  second episode of heavy vaginal bleeding and a repeat D&E was performed with ultrasound guidance. Uterus was cleared of all products of conception. Hemabate was given for continued bleeding. For further details of procedures, please refer to the operative reports. On day of discharge, patient's bleeding was minimal. She denied pain or dizziness, was tolerating PO and her vitals were stable. Her final hemoglobin prior to discharge was 7.4. She was deemed stable for discharge to home.   Discharge Exam: Filed Vitals:   03/01/13 2110 03/02/13 0201 03/02/13 0601 03/02/13 1000  BP: 97/52 90/56 95/41  94/54  Pulse: 94 70 72 95  Temp: 98.4 F (36.9 C) 98.2 F (36.8 C) 98.2 F (36.8 C) 98.7 F (37.1 C)  TempSrc: Oral Oral Oral Oral  Resp: 18 18 18 18   Height:      Weight:      SpO2: 98% 99% 100% 99%    General appearance: alert, cooperative and no distress Head: Normocephalic, without obvious abnormality, atraumatic Eyes: conjunctiva clear.  EOM's intact.  Resp: clear to auscultation bilaterally Cardio: regular rate and rhythm, S1, S2 normal, no murmur, click, rub or gallop GI: Soft, non-tender, non-distended, normal bowel sounds Extremities: extremities normal, atraumatic, no cyanosis or edema and Homans sign is negative, no sign of DVT Pulses: 2+ and symmetric Skin: Skin color, texture, turgor normal. No rashes or lesions  Discharged  Condition: stable  Disposition: Discharge home Diet:  Regular Activity: Pelvic rest   Medication List    TAKE these medications       ferrous sulfate 325 (65 FE) MG tablet  Commonly known as:  FERROUSUL  Take 1 tablet (325 mg total) by mouth 2 (two) times daily.     ibuprofen 600 MG tablet  Commonly known as:  ADVIL,MOTRIN  Take 600 mg by mouth as needed for pain.     ibuprofen 400 MG tablet  Commonly known as:  ADVIL,MOTRIN  Take 1 tablet (400 mg total) by mouth every 6 (six) hours as needed.     oxyCODONE-acetaminophen 5-325 MG per tablet  Commonly known as:  PERCOCET/ROXICET  Take 1-2 tablets by mouth every 4 (four) hours as needed for pain.     PRENATAL VITAMINS 0.8 MG tablet  Take 1 tablet by mouth daily.       Follow-up Information   Follow up with [facility] On 03/31/2013. (9:45 AM for postpartum visit. Call or return  as needed if symptoms worsen)    Contact information:   [address] [place] [address] [phone]     Signed: Napoleon Form, MD 03/02/2013 10:28 AM

## 2013-03-02 NOTE — Progress Notes (Signed)
Pt is discharged in the care of parents. Downstairs per ambulatory. Stable. Old abdominal incisions are clean and dry. Denies any excessive vaginal bleeding. Discharged instructions were given to pt. Questions were asked and answered. Aware of infant not being discharged from [facility].

## 2013-03-02 NOTE — Progress Notes (Signed)
UR completed 

## 2013-03-02 NOTE — Progress Notes (Signed)
1 Day Post-Op Procedure(s) (LRB): EXAM UNDER ANESTHESIA (N/A) DILATATION AND EVACUATION with Ultrasound (N/A)  Subjective: Patient reports no further bleeding,  She c/o puffiness at IV site in rt hand, not currently in use(saline lock) also, IV in ext jugular is saline lock.    Objective: I have reviewed patient's vital signs, medications and labs.  General: alert, cooperative and using breast pump  GI: soft, non-tender; bowel sounds normal; no masses,  no organomegaly Vaginal Bleeding: minimal CBC    Component Value Date/Time   WBC 16.3* 03/02/2013 0652   RBC 2.52* 03/02/2013 0652   HGB 7.4* 03/02/2013 0652   HGB 11.2 09/28/2012   HCT 21.1* 03/02/2013 0652   HCT 34 09/28/2012   PLT 185 03/02/2013 0652   PLT 228 09/28/2012   MCV 83.7 03/02/2013 0652   MCH 29.4 03/02/2013 0652   MCHC 35.1 03/02/2013 0652   RDW 15.3 03/02/2013 0652   LYMPHSABS 3.5 03/02/2013 0652   MONOABS 1.4* 03/02/2013 0652   EOSABS 0.1 03/02/2013 0652   BASOSABS 0.0 03/02/2013 0652     Assessment: s/p Procedure(s): EXAM UNDER ANESTHESIA (N/A) DILATATION AND EVACUATION with Ultrasound (N/A): stable, tolerating diet and anemia  Plan: Discontinue IV fluids Discharge home Rx for Iron, Vits.  LOS: 2 days    FERGUSON,JOHN V 03/02/2013, 7:54 AM

## 2013-03-05 NOTE — Discharge Summary (Signed)
Attestation of Attending Supervision of Advanced Practitioner: Evaluation and management procedures were performed by the PA/NP/CNM/OB Fellow under my supervision/collaboration. Chart reviewed and agree with management and plan.  FERGUSON,JOHN V 03/05/2013 8:36 PM   

## 2013-03-27 ENCOUNTER — Ambulatory Visit (INDEPENDENT_AMBULATORY_CARE_PROVIDER_SITE_OTHER): Payer: Medicaid Other | Admitting: Obstetrics & Gynecology

## 2013-03-27 ENCOUNTER — Encounter: Payer: Self-pay | Admitting: Obstetrics & Gynecology

## 2013-03-27 NOTE — Patient Instructions (Addendum)
Postpartum Tubal Ligation  A postpartum tubal ligation (PPTL) is a procedure that blocks the fallopian tubes right after childbirth or 1 2 days after childbirth. PPTL is done before the uterus returns to its normal location. The procedure is also called a minilaparotomy. By blocking the fallopian tubes, the eggs that are released from the ovaries cannot enter the uterus and sperm cannot reach the egg. A PPTL is done so you will not be able to get pregnant or have a baby.   Although this procedure may be reversed, it should be considered permanent and irreversible. If you want to have future pregnancies, you should not have this procedure.  LET YOUR CAREGIVER KNOW ABOUT:   Allergies to food or medicine.   Medicines taken, including vitamins, herbs, eyedrops, over-the-counter medicines, and creams.   Use of steroids (by mouth or creams).   Previous problems with numbing medicines.   History of bleeding problems or blood clots.   Any recent colds or infections.   Previous surgery.   Other health problems, including diabetes and kidney problems.  RISKS AND COMPLICATIONS   Infection.   Bleeding.   Injury to other organs.   Anesthetic side effects.   Failure of the procedure.   Ectopic pregnancy.   Future regret about having the procedure done.  BEFORE THE PROCEDURE    You may need to sign certain permission forms with your insurance up to 30 days before your due date.   After delivering your baby, you cannot eat or drink anything if the procedure is performed the same day. If you are having the procedure a day after delivering, you may be able to eat and drink until midnight. Your caregiver will give you specific directions depending on your situation.  PROCEDURE    If done 1 2 days after delivery:   You will be given a medicine to make you sleep (general anesthetic) during the procedure.   A tube will be put down your throat to help your breath while under general anesthesia.   A small cut  (incision) is made just beneath the belly button.   The fallopian tubes are brought up through the incision.   The fallopian tubes are then sealed, tied, or cut.   If done after a caesarean delivery:   Tubal ligation is done through the incision already made (after the baby is delivered).   Once the tubes are blocked, the incision is closed with stitches (sutures). A bandage will be placed over the incisions.  AFTER THE PROCEDURE   You may have some pain or cramps in the abdominal area for the next 3 7 days.   You will be given pain medicine to ease any discomfort.   You may also feel sick to your stomach if you were given a general anesthetic.   You may have some mild discomfort in the throat. This is from the tube that may have been placed in your throat while you were sleeping.   You may feel tired and should rest the remainder of the day.  Document Released: 12/14/2005 Document Revised: 06/14/2012 Document Reviewed: 03/26/2012  [employer] Patient Information 2013 [employer]

## 2013-03-27 NOTE — Progress Notes (Signed)
Patient ID: Tiffany Floyd, female   DOB: 04-Jul-1981, 32 y.o.   MRN: [id number] Subjective:     Tiffany Floyd is a 32 y.o. female who presents for a postpartum visit. She is 4 week postpartum following a low cervical transverse Cesarean section. I have fully reviewed the prenatal and intrapartum course. Baby's course has been baby remains in NICU has RSV. Baby is feeding by bottle - unk. Bleeding no bleeding. Bowel function is normal. Bladder function is normal. Patient is not sexually active. Contraception method is tubal ligation. Postpartum depression screening: negative.  The following portions of the patient's history were reviewed and updated as appropriate: allergies, current medications, past family history, past medical history, past social history, past surgical history and problem list.  Review of Systems Pertinent items are noted in HPI.   Objective:    BP 121/80  Pulse 80  Temp(Src) 98.4 F (36.9 C) (Oral)  Wt 160 lb (72.576 kg)  BMI 26.63 kg/m2  Breastfeeding? No  General:  alert and no distress           Abdomen: soft, non-tender; bowel sounds normal; no masses,  no organomegaly                          Assessment:     4 weeks postpartum exam. Pap smear not done at today's visit.   Plan:    1. Contraception: tubal ligation 2. May RTW 3. Follow up in: 6 months or as needed.

## 2013-03-30 ENCOUNTER — Ambulatory Visit: Payer: Medicaid Other | Admitting: Advanced Practice Midwife

## 2013-03-31 ENCOUNTER — Ambulatory Visit: Payer: Medicaid Other | Admitting: Obstetrics & Gynecology

## 2013-12-01 ENCOUNTER — Telehealth: Payer: Self-pay | Admitting: *Deleted

## 2013-12-01 NOTE — Telephone Encounter (Signed)
Wrong patient wrong chart.

## 2014-01-17 ENCOUNTER — Ambulatory Visit (INDEPENDENT_AMBULATORY_CARE_PROVIDER_SITE_OTHER): Payer: Medicaid Other

## 2014-01-17 VITALS — BP 120/81 | HR 91 | Temp 98.0°F | Ht 63.0 in | Wt 165.5 lb

## 2014-01-17 DIAGNOSIS — IMO0001 Reserved for inherently not codable concepts without codable children: Secondary | ICD-10-CM

## 2014-01-17 DIAGNOSIS — A184 Tuberculosis of skin and subcutaneous tissue: Secondary | ICD-10-CM

## 2014-01-17 NOTE — Progress Notes (Signed)
Pt. Here for PPD test for work. Intradermal injection given in right forearm; slight bubble formation noted. Pt. Tolerated well. Informed pt. She must return on Friday; pt. States she will be here between 0800-0830. Per. Dr. Macon LargeAnyanwu this is OK even though this is less than 48 hours as if a reaction were to occur it would be early.

## 2014-01-19 NOTE — Progress Notes (Signed)
Pt. In today to get PPD test read. No visible bump or skin irritation. Test negative. Letter given.

## 2014-06-17 IMAGING — US US OB TRANSVAGINAL
1 series · 13 of 24 positions shown · non-contrast
Comparison: none

[Series 1: us ob transvaginal · 0.23mm/px · 13 of 24 slices shown]
[im 1/24]
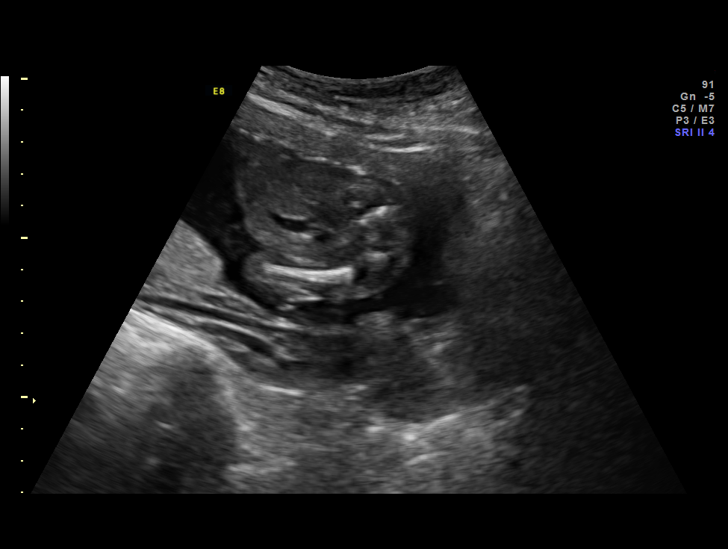
[im 3/24]
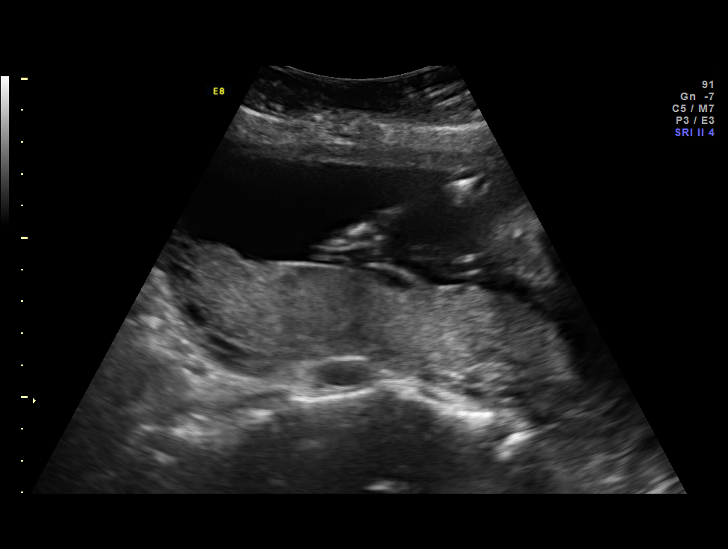
[im 5/24]
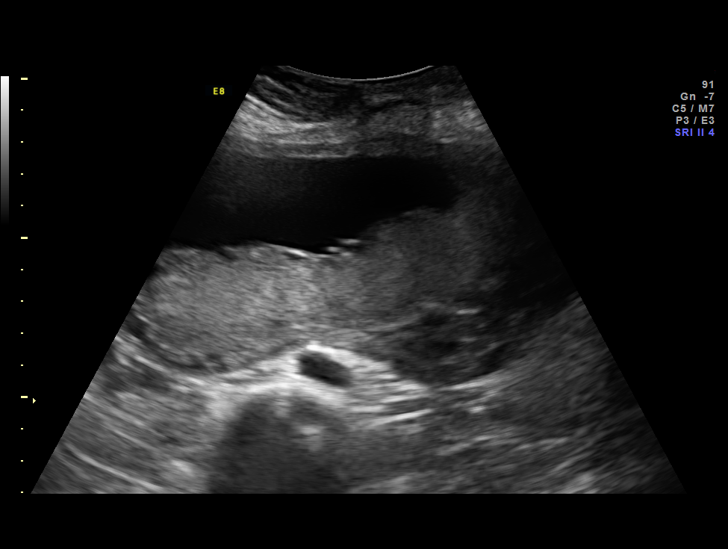
[im 7/24]
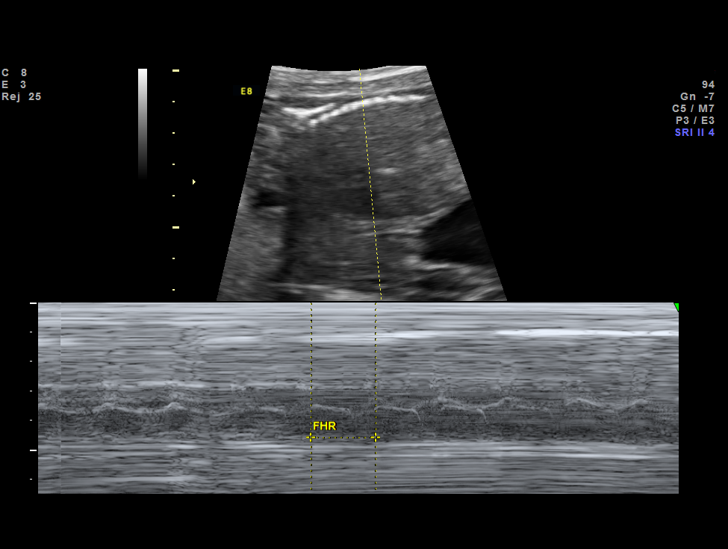
[im 9/24]
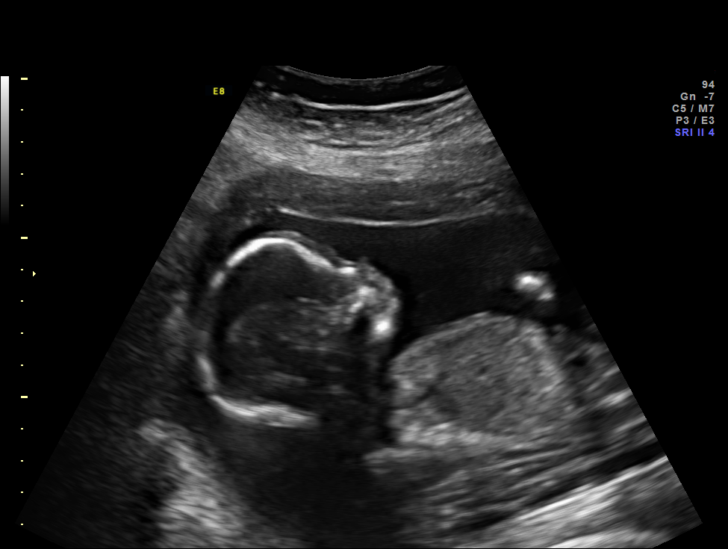
[im 11/24]
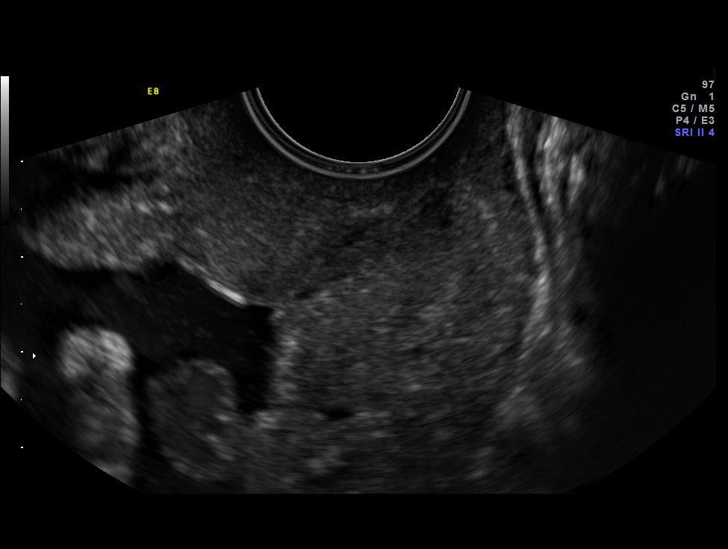
[im 13/24]
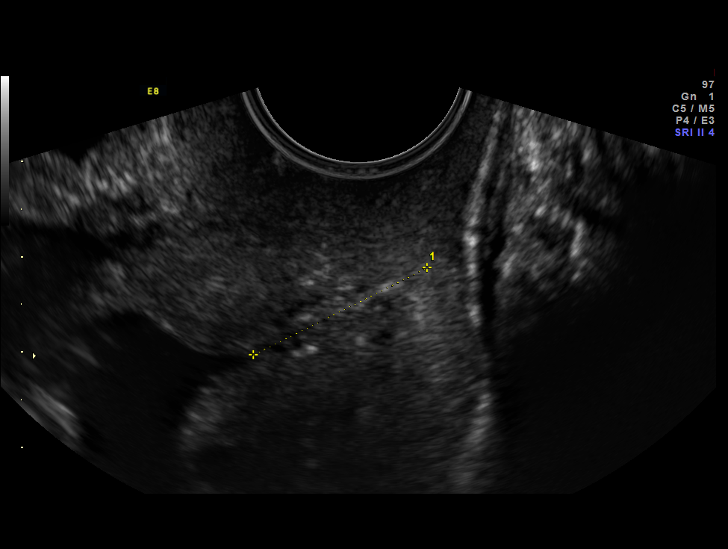
[im 14/24]
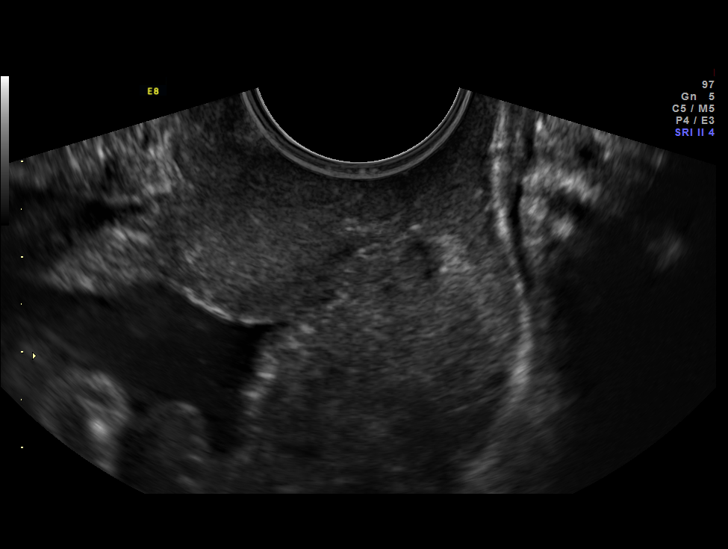
[im 16/24]
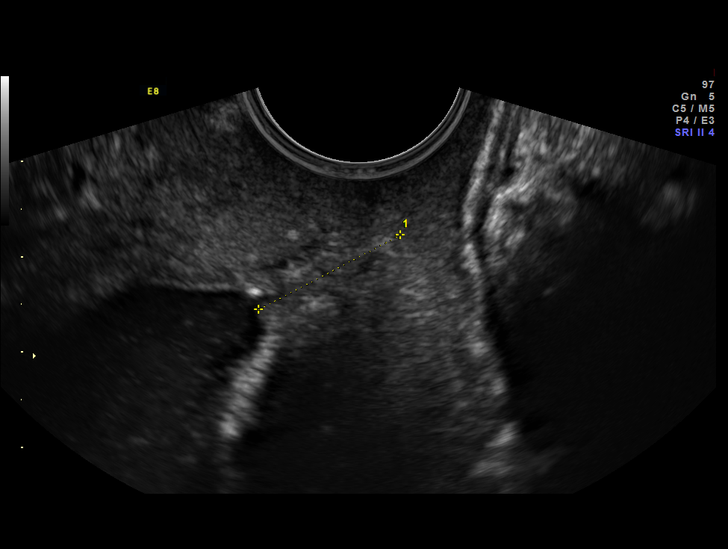
[im 18/24]
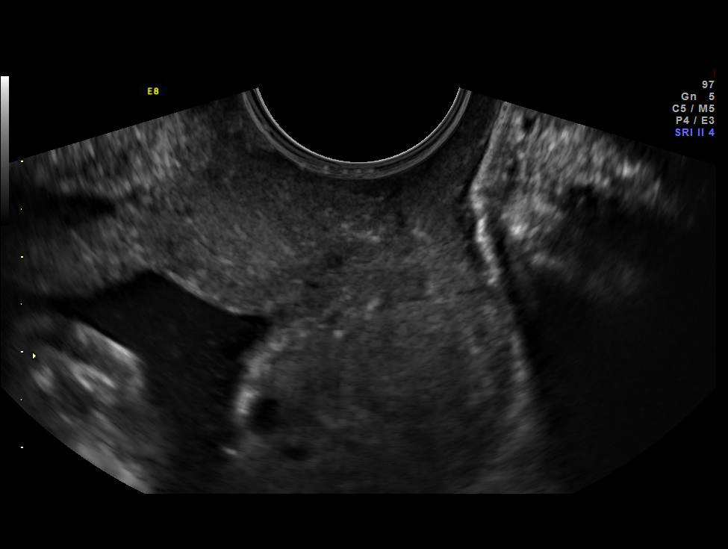
[im 20/24]
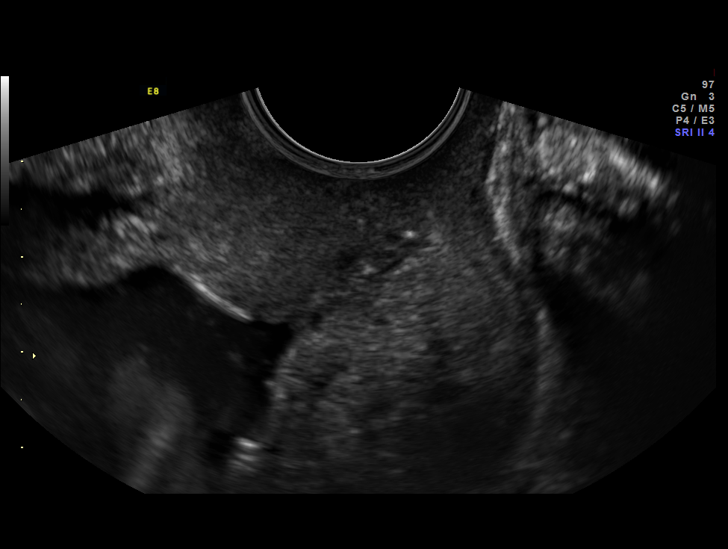
[im 22/24]
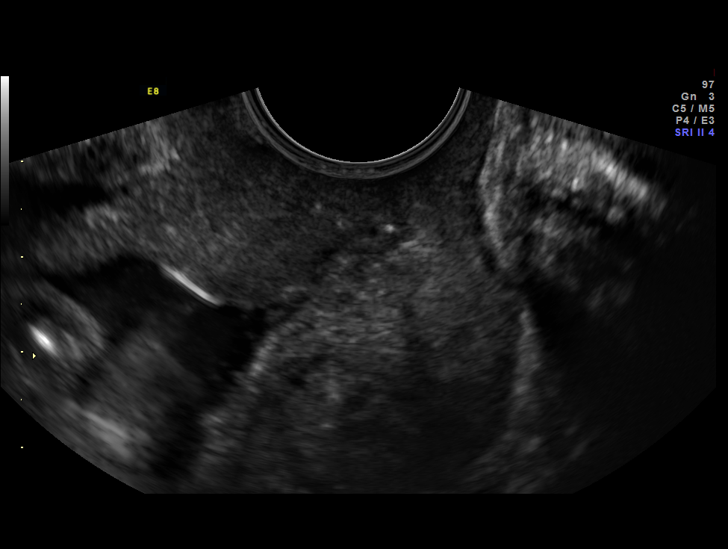
[im 24/24]
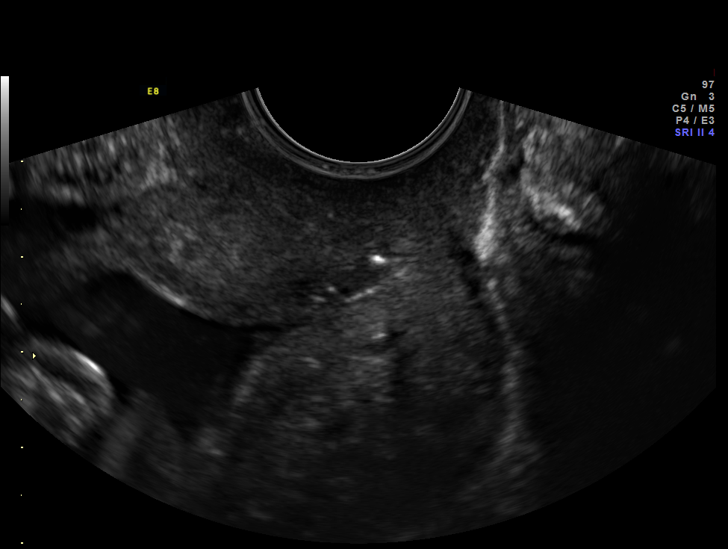

[13 of 24 positions shown; findings below may reference images not displayed]

OBSTETRICS REPORT
                      (Signed Final 12/02/2012 [DATE])

Service(s) Provided

 US OB TRANSVAGINAL                                    76817.0
Indications

 Poor obstetric history: Previous neonatal death
 Poor obstetric history: Previous preterm delivery
 (24 week PROM)
 Previous cesarean section
Fetal Evaluation

 Num Of Fetuses:    1
 Fetal Heart Rate:  155                          bpm
 Cardiac Activity:  Observed
 Presentation:      Breech
 Placenta:          Posterior, above cervical
                    os
 P. Cord            Previously Visualized
 Insertion:

 Amniotic Fluid
 AFI FV:      Subjectively within normal limits
Gestational Age

 Best:          20w 0d     Det. By:  U/S C R L (09/13/12)     EDD:   04/21/13
Cervix Uterus Adnexa

 Cervical Length:    1.6      cm

 Cervix:       Normal appearance by transvaginal scan
Comments

 Transvaginal cervical length is 1.6 cm today, which is notably
 decreased from last measurement of ??? one week ago.
 Given Ms. This is concerning for evolving cervical
 insufficiency given Ms. Madisen history of a 24 week
 delivery.  She is a good candidate for a ultrasound indicated
 cerclage.  The risks, benefits, and alternatives of cervical
 cerclage were discussed with the patient including the risk of
 pregnancy loss due to infection, labor, or membrane rupture
 during the procedure.  She wishes to proceed with cerclage
 placement.  Dr. Wetzel informed.  Ms. Libusa Lahooti have a
 cerclage placement later today.
Impression

 Single living intrauterine pregnancy at 20 weeks 0 days.
 History of prior 24 weeks delivery.
 Normal amniotic fluid volume.
 Cervical shortening (1.6 cm).
Recommendations

 Recommend follow-up ultrasound examination one week
 after cerclage placement.

                Ropson, Lukenny

## 2014-07-07 IMAGING — US US OB TRANSVAGINAL
1 series · 13 of 16 positions shown · non-contrast
Comparison: none

[Series 1: us ob transvaginal · 0.23mm/px · 16 acquisitions, 13 frames shown]
[im 1/16]
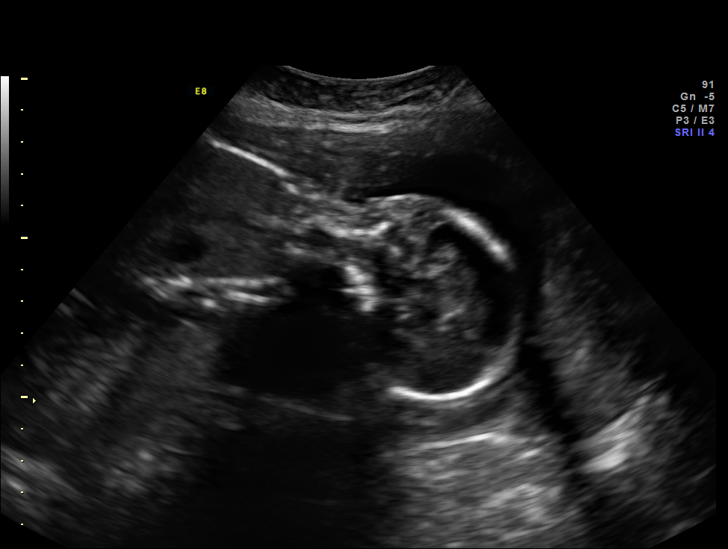
[im 2/16]
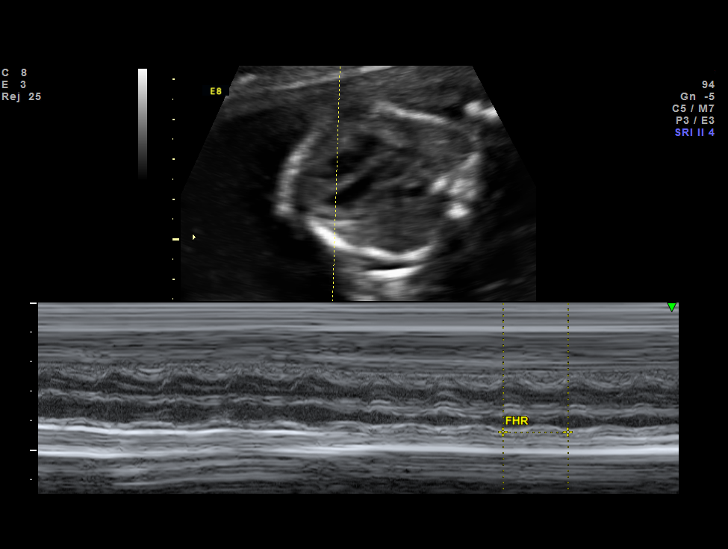
[im 4/16]
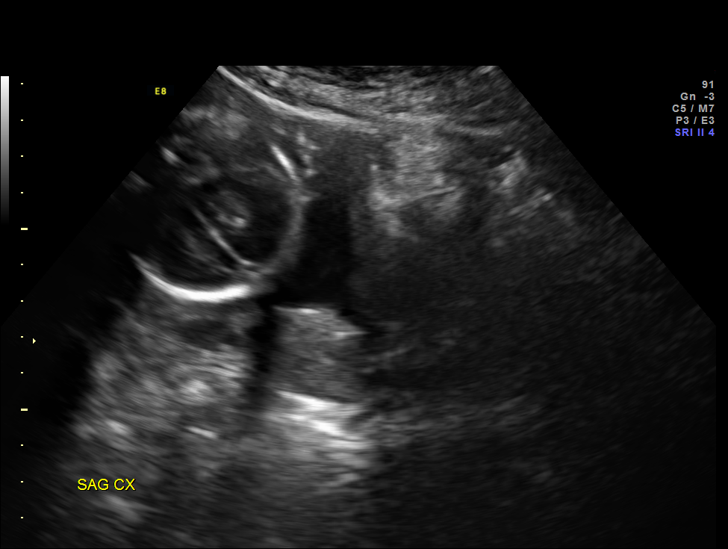
[im 5/16]
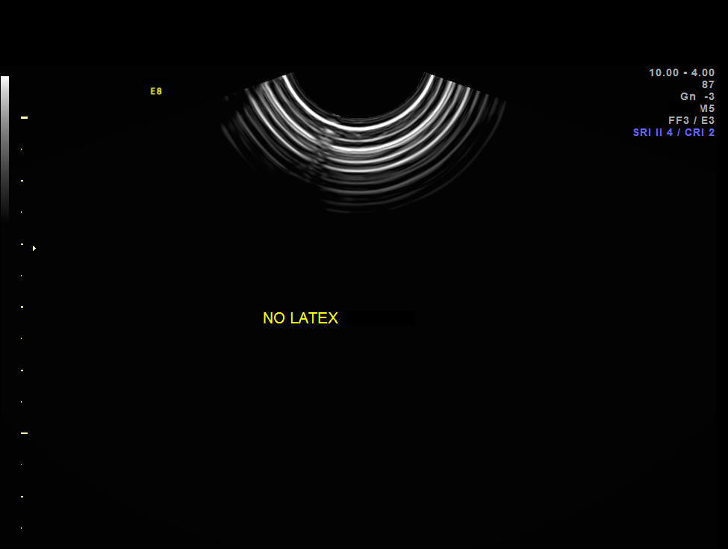
[im 6/16]
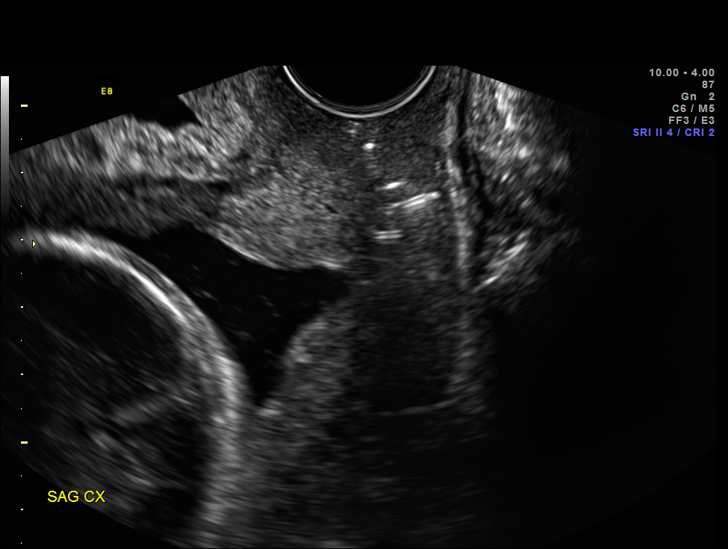
[im 7/16]
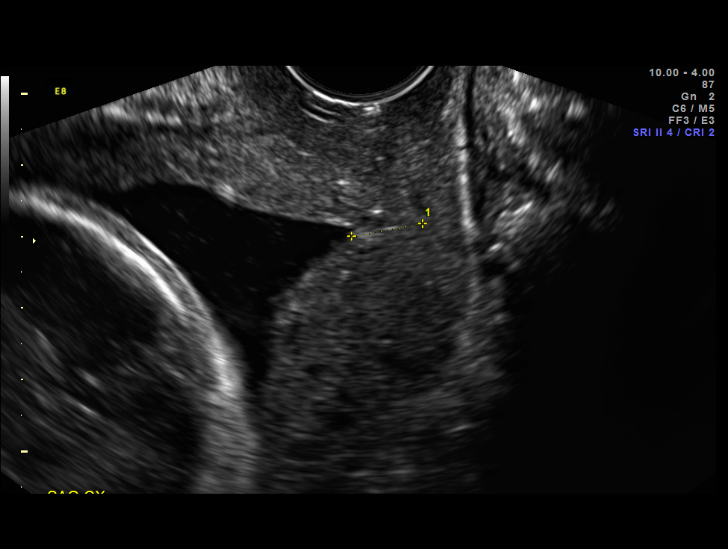
[im 9/16]
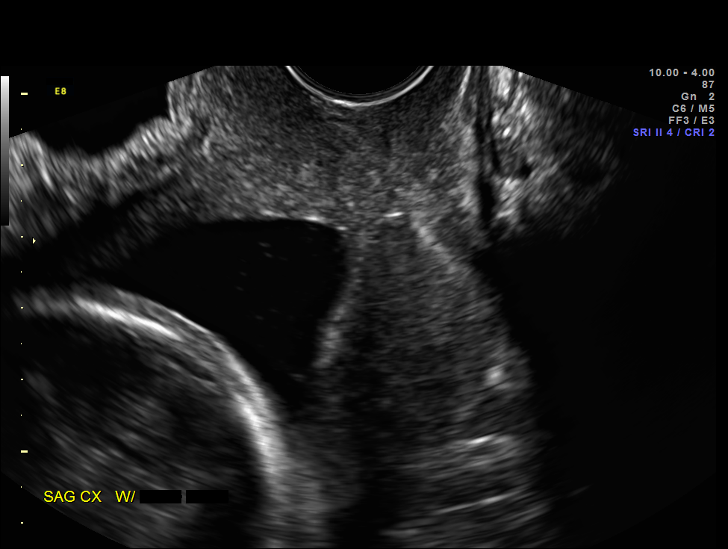
[im 10/16]
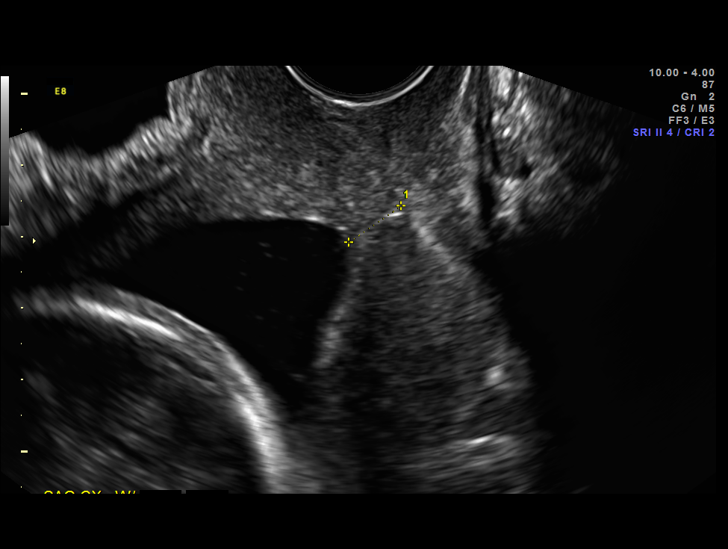
[im 11/16]
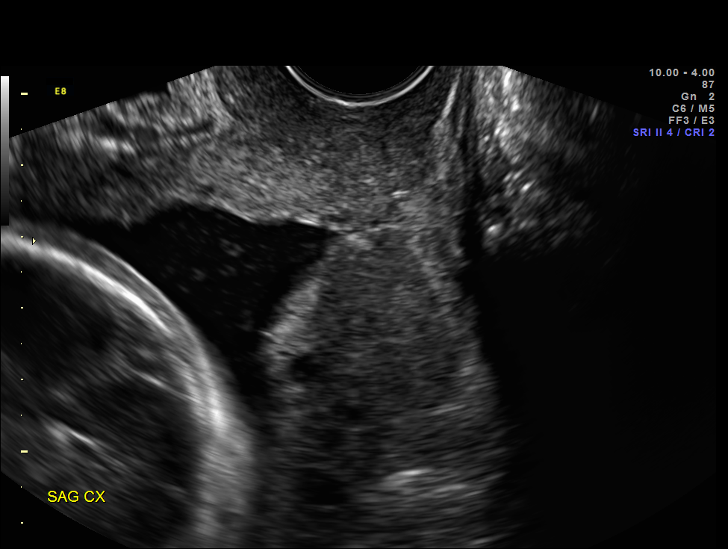
[im 12/16]
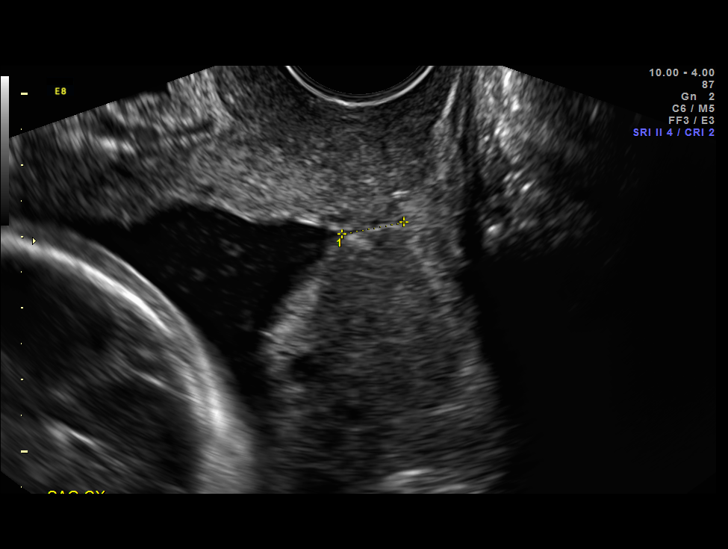
[im 13/16]
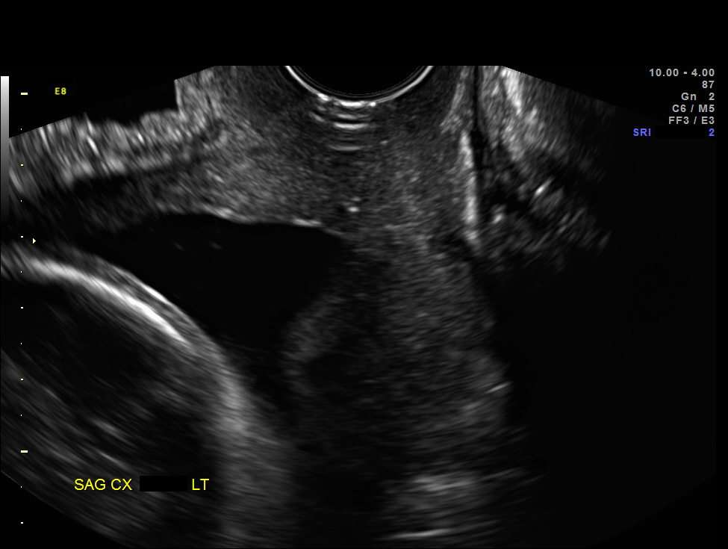
[im 15/16]
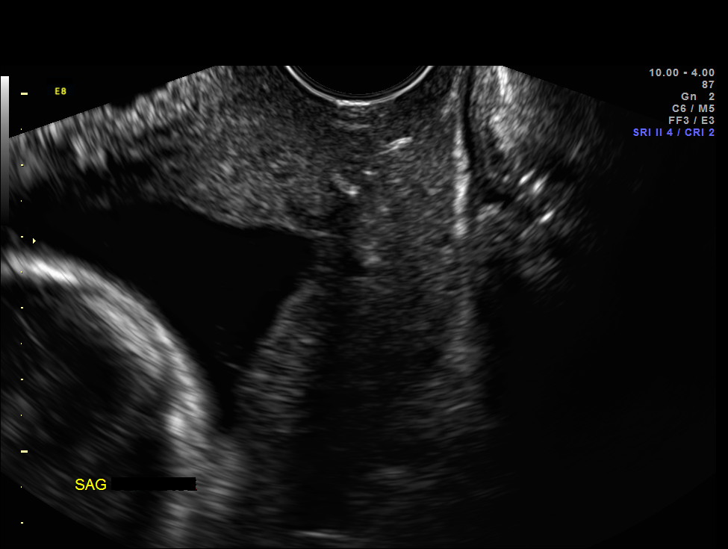
[im 16/16]
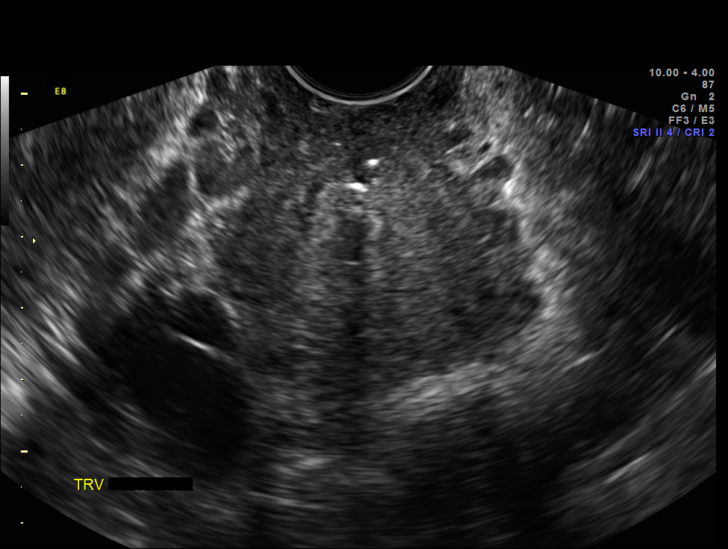

[13 of 16 positions shown; findings below may reference images not displayed]

OBSTETRICS REPORT
                      (Signed Final 12/22/2012 [DATE])

Service(s) Provided

 US OB TRANSVAGINAL                                    76817.0
Indications

 Cervical shortening (S/P cerclage)
 Poor obstetric history: Previous neonatal death
 Poor obstetric history: Previous preterm delivery
 (24 week PROM)
 Previous cesarean section
 Uterine fibroids
Fetal Evaluation

 Num Of Fetuses:    1
 Fetal Heart Rate:  155                          bpm
 Cardiac Activity:  Observed
 Presentation:      Cephalic
 Placenta:          Posterior, above cervical
                    os
 P. Cord            Previously Visualized
 Insertion:

 Amniotic Fluid
 AFI FV:      Subjectively within normal limits
                                             Larg Pckt:     5.4  cm
Gestational Age

 Best:          22w 6d     Det. By:  U/S C R L (09/13/12)     EDD:   04/21/13
Cervix Uterus Adnexa

 Cervical Length:    0.9      cm

 Cervix:       Measured transvaginally. Funneling of internal os
               noted.
Myomas

 Site                     L(cm)      W(cm)       D(cm)      Location
 Posterior
 Blood Flow                  RI       PI       Comments
                                               Previously seen
Impression

 Single IUP at 22 [DATE] weeks
 Cervical insufficiency s/p Cerclage - for cervical length only
 Cervical length 0.9 cm with V-shaped funneling to the level of
 the cerclage stitch
Recommendations

 Continue weekly 17-P injections
 Recommend beginning modified bedrest at home (letter
 given)
 Follow up in 1 week for cervical length and growth; plan
 Scheer series after that visit.

## 2014-07-15 ENCOUNTER — Encounter (HOSPITAL_COMMUNITY): Payer: Self-pay | Admitting: Emergency Medicine

## 2014-07-15 ENCOUNTER — Emergency Department (HOSPITAL_COMMUNITY)
Admission: EM | Admit: 2014-07-15 | Discharge: 2014-07-15 | Disposition: A | Discharge status: Home / Self Care | Payer: Medicaid Other | Attending: Emergency Medicine | Admitting: Emergency Medicine

## 2014-07-15 DIAGNOSIS — Z8659 Personal history of other mental and behavioral disorders: Secondary | ICD-10-CM | POA: Insufficient documentation

## 2014-07-15 DIAGNOSIS — S1091XA Abrasion of unspecified part of neck, initial encounter: Secondary | ICD-10-CM

## 2014-07-15 DIAGNOSIS — Y9389 Activity, other specified: Secondary | ICD-10-CM | POA: Insufficient documentation

## 2014-07-15 DIAGNOSIS — IMO0002 Reserved for concepts with insufficient information to code with codable children: Secondary | ICD-10-CM | POA: Insufficient documentation

## 2014-07-15 DIAGNOSIS — Z79899 Other long term (current) drug therapy: Secondary | ICD-10-CM | POA: Insufficient documentation

## 2014-07-15 DIAGNOSIS — Z9889 Other specified postprocedural states: Secondary | ICD-10-CM | POA: Insufficient documentation

## 2014-07-15 DIAGNOSIS — Y92410 Unspecified street and highway as the place of occurrence of the external cause: Secondary | ICD-10-CM | POA: Insufficient documentation

## 2014-07-15 DIAGNOSIS — Z789 Other specified health status: Secondary | ICD-10-CM

## 2014-07-15 DIAGNOSIS — J029 Acute pharyngitis, unspecified: Secondary | ICD-10-CM | POA: Insufficient documentation

## 2014-07-15 IMAGING — US US OB TRANSVAGINAL
1 series · 12 of 28 positions shown · non-contrast
Comparison: none

[Series 1: us ob transvaginal · 0.23mm/px · 12 of 53 slices shown]
[im 2/53]
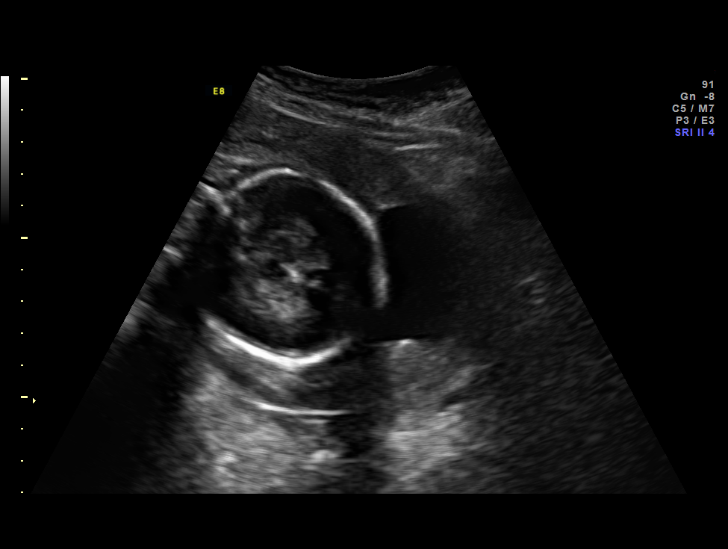
[im 6/53]
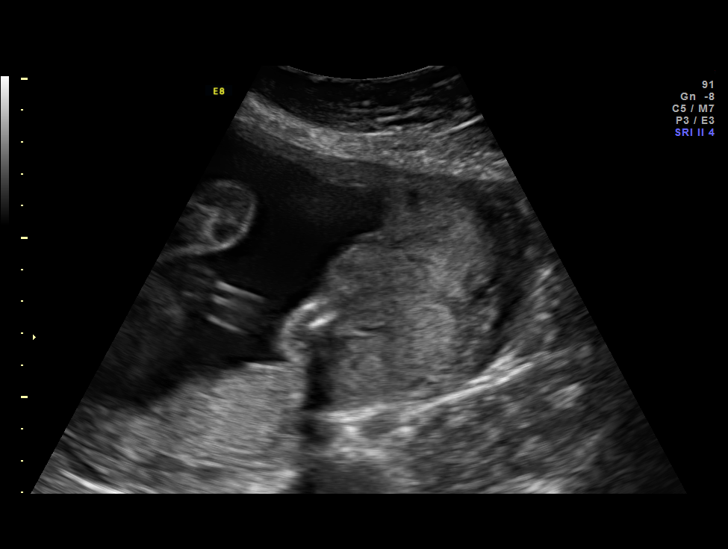
[im 10/53]
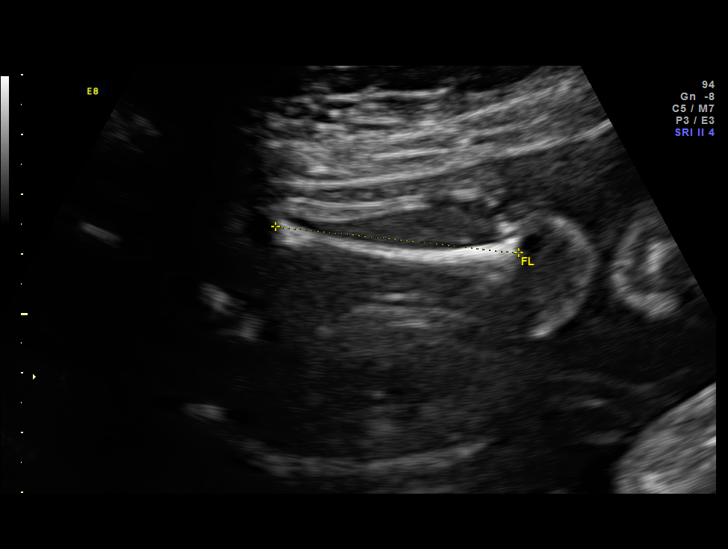
[im 16/53]
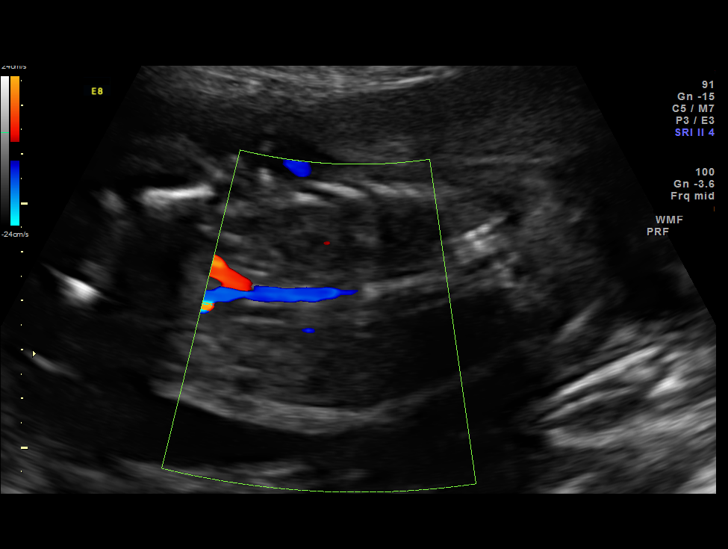
[im 20/53]
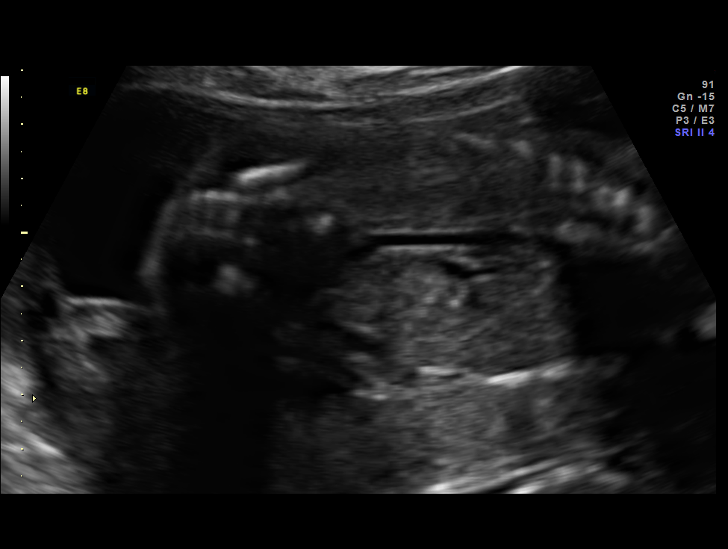
[im 24/53]
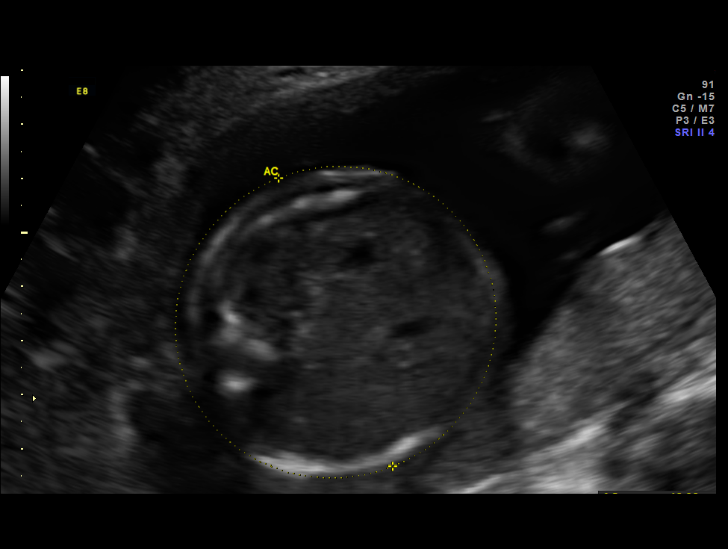
[im 29/53]
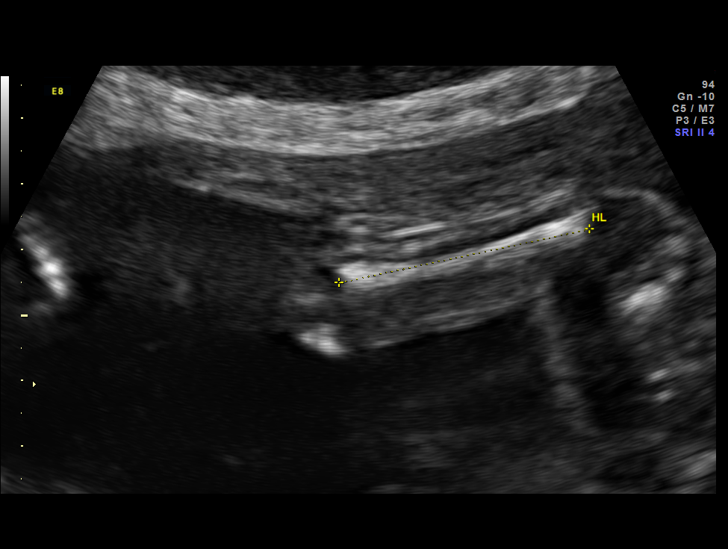
[im 33/53]
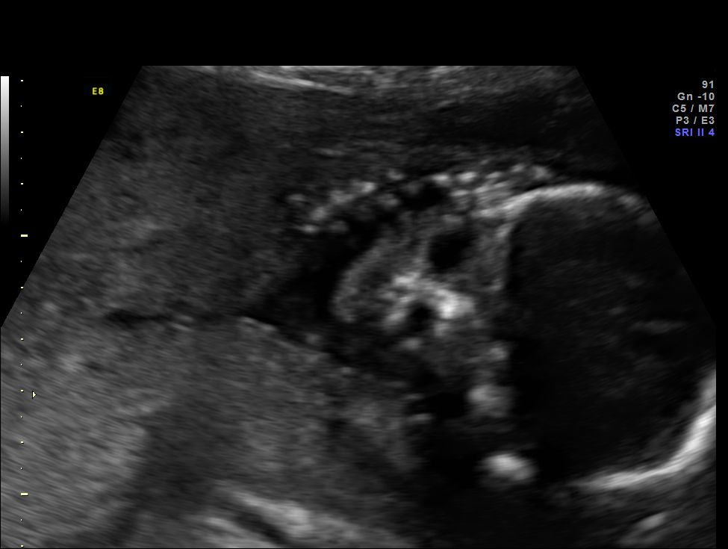
[im 37/53]
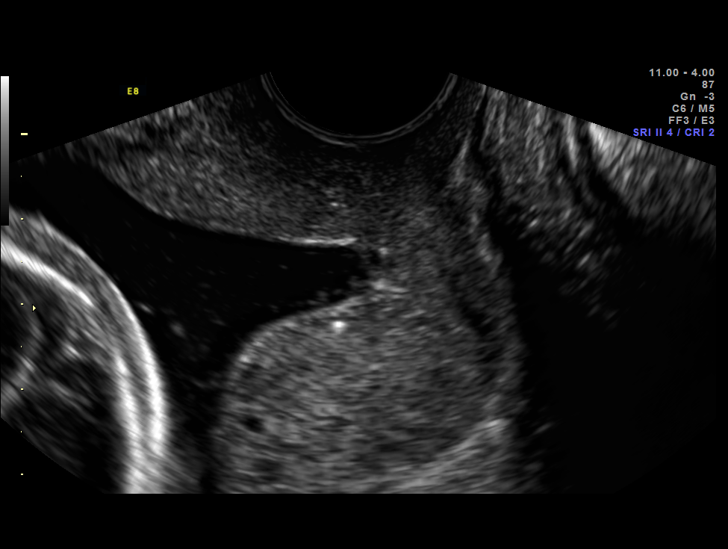
[im 43/53]
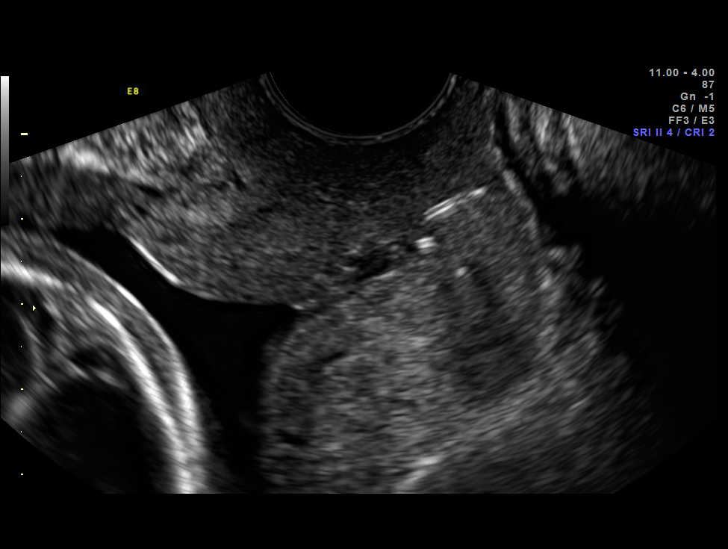
[im 47/53]
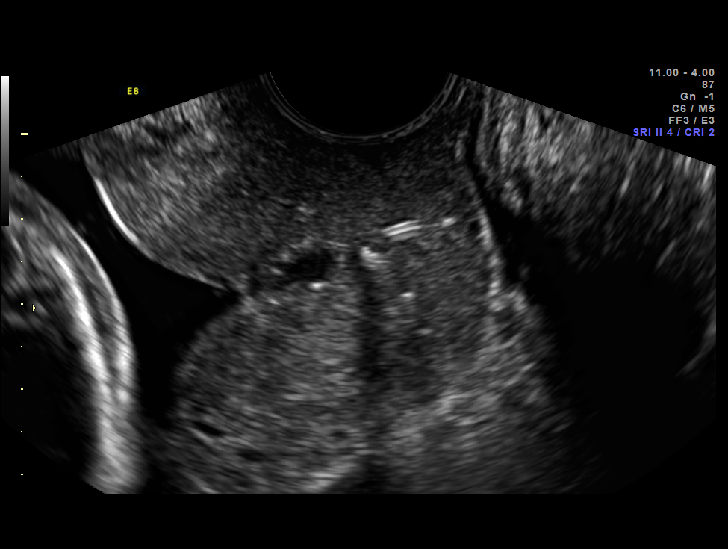
[im 51/53]
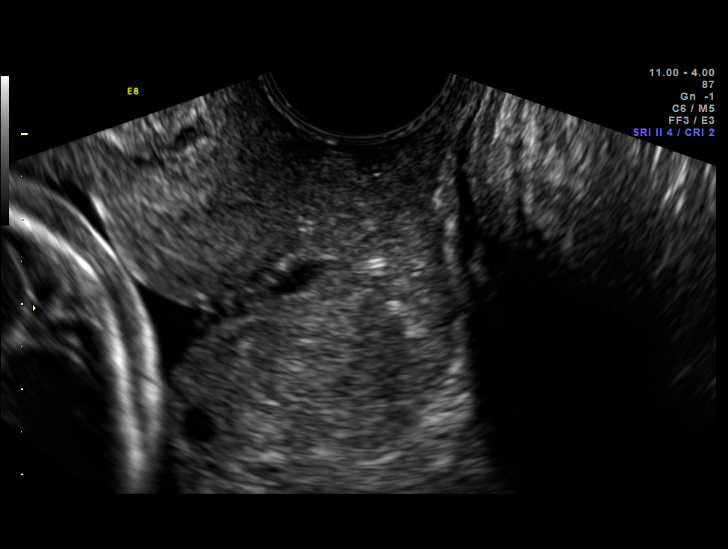

[12 of 28 positions shown; findings below may reference images not displayed]

OBSTETRICS REPORT
                      (Signed Final 12/30/2012 [DATE])

Service(s) Provided

 US OB TRANSVAGINAL                                    76817.0
 US OB FOLLOW UP                                       76816.1
Indications

 Cervical shortening (S/P cerclage)
 Poor obstetric history: Previous neonatal death
 Poor obstetric history: Previous preterm delivery
 (24 week PROM)
 Previous cesarean section
 Uterine fibroids
Fetal Evaluation

 Num Of Fetuses:    1
 Fetal Heart Rate:  145                          bpm
 Cardiac Activity:  Observed
 Presentation:      Cephalic
 Placenta:          Posterior, above cervical
                    os
 P. Cord            Previously Visualized
 Insertion:

 Amniotic Fluid
 AFI FV:      Subjectively within normal limits
                                             Larg Pckt:     4.2  cm
Biometry

 BPD:     56.3  mm     G. Age:  23w 1d                CI:         74.1   70 - 86
 OFD:       76  mm                                    FL/HC:      19.3   18.7 -

 HC:     213.9  mm     G. Age:  23w 3d       16  %    HC/AC:      1.17   1.05 -

 AC:     183.1  mm     G. Age:  23w 1d       18  %    FL/BPD:     73.4   71 - 87
 FL:      41.3  mm     G. Age:  23w 3d       21  %    FL/AC:      22.6   20 - 24
 HUM:     38.4  mm     G. Age:  23w 4d       32  %

 Est. FW:     579  gm      1 lb 4 oz     36  %
Gestational Age

 U/S Today:     23w 2d                                        EDD:   04/26/13
 Best:          24w 0d     Det. By:  U/S C R L (09/13/12)     EDD:   04/21/13
Anatomy

 Cranium:          Previously seen        Aortic Arch:      Previously seen
 Fetal Cavum:      Previously seen        Ductal Arch:      Previously seen
 Ventricles:       Appears normal         Diaphragm:        Appears normal
 Choroid Plexus:   Previously seen        Stomach:          Appears normal, left
                                                            sided
 Cerebellum:       Previously seen        Abdomen:          Previously seen
 Posterior Fossa:  Previously seen        Abdominal Wall:   Previously seen
 Nuchal Fold:      Previously seen        Cord Vessels:     Previously seen
 Face:             Orbits and profile     Kidneys:          Appear normal
                   previously seen
 Lips:             Previously seen        Bladder:          Appears normal
 Heart:            Appears normal         Spine:            Previously seen
                   (4CH, axis, and
                   situs)
 RVOT:             Previously seen        Lower             Previously seen
                                          Extremities:
 LVOT:             Previously seen        Upper             Previously seen
                                          Extremities:

 Other:  Male gender. Heels and 5th digit previously seen.
Cervix Uterus Adnexa

 Cervical Length:    0.9      cm

 Cervix:       Measured transvaginally.
Impression

 Single IUP at 24 0/7 weeks
 Cervical insufficiency s/p Cerclage
 Interval growth is appropriate (36th %tile)
 Normal interval anatomy

 Cervical length 0.9 cm with V-shaped funneling to the level of
 the cerclage stitch- unchanged from previous exam
Recommendations

 Recommend continued modified bedrest at home and weekly
 17-P injections
 Chato series - first dose given today
 Follow up cervical length next week; if futher cervical
 shortening noted, would recommend inpatient admission.

## 2014-07-15 MED ORDER — ACETAMINOPHEN 500 MG PO TABS
1000.0000 mg | ORAL_TABLET | Freq: Once | ORAL | Status: AC
  Administered 2014-07-15: 1000 mg via ORAL
  Filled 2014-07-15: qty 2

## 2014-07-15 MED ORDER — NAPROXEN 500 MG PO TABS: 500.0000 mg | ORAL_TABLET | Freq: Two times a day (BID) | ORAL | Status: DC

## 2014-07-15 MED ORDER — HYDROCODONE-ACETAMINOPHEN 5-325 MG PO TABS: 1.0000 | ORAL_TABLET | ORAL | Status: DC | PRN
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
## 2014-07-16 ENCOUNTER — Emergency Department (INDEPENDENT_AMBULATORY_CARE_PROVIDER_SITE_OTHER)
Admission: EM | Admit: 2014-07-16 | Discharge: 2014-07-16 | Disposition: A | Discharge status: Home / Self Care | Payer: Self-pay | Source: Home / Self Care | Attending: Family Medicine | Admitting: Family Medicine

## 2014-07-16 ENCOUNTER — Encounter (HOSPITAL_COMMUNITY): Payer: Self-pay | Admitting: Emergency Medicine

## 2014-07-16 DIAGNOSIS — M79609 Pain in unspecified limb: Secondary | ICD-10-CM

## 2014-07-16 DIAGNOSIS — Z041 Encounter for examination and observation following transport accident: Secondary | ICD-10-CM
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
## 2014-07-16 NOTE — Discharge Instructions (Signed)
Continue medications and ice, return as needed.
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
## 2014-07-18 NOTE — ED Provider Notes (Signed)
Medical screening examination/treatment/procedure(s) were performed by non-physician practitioner and as supervising physician I was immediately available for consultation/collaboration.   EKG Interpretation None       Juliet RudeNathan R. Rubin PayorPickering, MD 07/18/14 [phone]

## 2014-10-29 ENCOUNTER — Encounter (HOSPITAL_COMMUNITY): Payer: Self-pay | Admitting: Emergency Medicine

## 2016-08-14 ENCOUNTER — Ambulatory Visit (HOSPITAL_COMMUNITY)
Admission: EM | Admit: 2016-08-14 | Discharge: 2016-08-14 | Disposition: A | Discharge status: Home / Self Care | Payer: BC Managed Care – PPO | Attending: Internal Medicine | Admitting: Internal Medicine

## 2016-08-14 ENCOUNTER — Encounter (HOSPITAL_COMMUNITY): Payer: Self-pay | Admitting: *Deleted

## 2016-08-14 DIAGNOSIS — K029 Dental caries, unspecified: Secondary | ICD-10-CM | POA: Diagnosis not present

## 2016-08-14 MED ORDER — AMOXICILLIN 500 MG PO CAPS: 500.0000 mg | ORAL_CAPSULE | Freq: Two times a day (BID) | ORAL | 0 refills | Status: AC

## 2016-08-14 NOTE — ED Triage Notes (Signed)
Pt  Reports   l  Lower  Toothache  Worse   Since  Last  Pm   denys  Any  Trauma   States  Has  A  Cavity

## 2016-08-14 NOTE — Discharge Instructions (Signed)
Emergency Department Resource Guide °1) Find a Doctor and Pay Out of Pocket °Although you won't have to find out who is covered by your insurance plan, it is a good idea to ask around and get recommendations. You will then need to call the office and see if the doctor you have chosen will accept you as a new patient and what types of options they offer for patients who are self-pay. Some doctors offer discounts or will set up payment plans for their patients who do not have insurance, but you will need to ask so you aren't surprised when you get to your appointment. ° °2) Contact Your Local Health Department °Not all health departments have doctors that can see patients for sick visits, but many do, so it is worth a call to see if yours does. If you don't know where your local health department is, you can check in your phone book. The CDC also has a tool to help you locate your state's health department, and many state websites also have listings of all of their local health departments. ° °3) Find a Walk-in Clinic °If your illness is not likely to be very severe or complicated, you may want to try a walk in clinic. These are popping up all over the country in pharmacies, drugstores, and shopping centers. They're usually staffed by nurse practitioners or physician assistants that have been trained to treat common illnesses and complaints. They're usually fairly quick and inexpensive. However, if you have serious medical issues or chronic medical problems, these are probably not your best option. ° °No Primary Care Doctor: °- Call [facility] at  832-8000 - they can help you locate a primary care doctor that  accepts your insurance, provides certain services, etc. °- Physician Referral Service- 1-800-533-3463 ° °Chronic Pain Problems: °Organization         Address  Phone   Notes  °[facility]  ([phone]) [phone] Patients need to be referred by their primary care doctor.  ° °Medication  Assistance: °Organization         Address  Phone   Notes  °[place] [facility] [address] °[place], [place] [address] (336) 641-8030 --Must be a resident of [place] °-- Must have NO insurance coverage whatsoever (no Medicaid/ Medicare, etc.) °-- The pt. MUST have a primary care doctor that directs their care regularly and follows them in the community °  °[facility]  (866) 331-1348   °[employer]  (888) 892-1162   ° °Agencies that provide inexpensive medical care: °Organization         Address                                                       Phone                                                                            Notes  °[facility]  (336) 832-8035   °[facility]    (336)   832-7272   °[facility] [address] °[place], [place] [address] (336) 832-4777   °[facility] [address], °[place] (336) 271-4999   °[facility]    (336) 373-0678   °[facility]    (336) 272-1050   °[facility] ° [address], [place] Phone:  (336) 832-4444, Fax:  (336) 832-4440 Hours of Operation:  9 am - 6 pm, M-F.  Also accepts Medicaid/Medicare and self-pay.  °[facility] ° [address], [place] Phone: (336) [phone], Fax: (336) [phone]. Hours of Operation:  8:30 am - 5:30 pm, M-F.  Also accepts Medicaid and self-pay.  °[facility] [place] [address], [place] Phone: (336) 878-6027   °[facility] [address], [place], [place] (336)723-1848, Ext. 123 Mondays & Thursdays: 7-9 AM.  First 15 patients are seen on a first come, first serve basis. °  ° °Medicaid-accepting [place] Providers: ° °Organization         Address                                                                       Phone                               Notes  °[facility] [address], [place] (336) [phone] Also accepts self-pay patients.  °[facility] [address], [place] ° (336) [phone]   °[facility] [address], [place] (336) [phone]   °[facility] [address] [place] [address], [place] (336) 299-7000   °[facility] [address], [place]  ° (336) [phone] Only accepts [place] Access Medicaid patients after they have their name applied to their card.  ° °Self-Pay (no insurance) in [place]: °  °Organization         Address                                                     Phone               Notes  °Sickle Cell Patients, [facility] [address], [place] (336) 832-1970   °[facility] [address], [place] (336) 832-4400   °[facility] ° [address], [place] (336) 992-4800   °[facility]/Dr. Osei-Bonsu ° [address] [place] [address], [place] or [address], [place] (336) [phone] Phone number for both [place] and [place] locations is the same.  °[facility] [address], [place] (336) [phone]   °[facility] [address] [place] [address], [place] or [address] (336) 852-7530 °(336) 878-2260   °[facility] [address], [place] (336) 350-1642, phone; (336) 294-5005, fax Sees patients 1st and 3rd Saturday of   every month.  Must not qualify for public or private insurance (i.e. Medicaid, Medicare, [place] Health Choice, Veterans' Benefits) • Household income should be no more than 200% of the poverty level •The clinic cannot treat you if you are pregnant or think you are pregnant • Sexually transmitted diseases are not treated at the clinic.  ° °_____________Dental Care:______________ °Organization         Address                                  Phone                       Notes  °[place]  [facility] [address], [place] (336) 641-6152 Accepts children up to age 21 who are enrolled in Medicaid or [place] Health Choice; pregnant women with a Medicaid card; and children who have applied for Medicaid or [place] Health Choice, but were declined, whose parents can pay a reduced fee at time of service.  °[place] [place]  [address], [place] (336) 641-7733 Accepts children up to age 21 who are enrolled in Medicaid or [place] Health Choice; pregnant women with a Medicaid card; and children who have applied for Medicaid or [place] Health Choice, but were declined, whose parents can pay a reduced fee at time of service.  °[facility] ° [address], [place] (336) 641-4533 Patients are seen by appointment only. Walk-ins are not accepted. [facility] will see patients 18 years of age and older. °Monday - Tuesday (8am-5pm) °Most Wednesdays (8:30-5pm) °$30 per visit, cash only  °[facility] ° [address], [place] (336) [phone] Patients are seen by appointment only. Walk-ins are not accepted. [facility] will see patients 18 years of age and older. °One Wednesday Evening (Monthly: Volunteer Based).  $30 per visit, cash only  °[facility]  (919) [phone] for adults; Children under age 4, call Graduate Pediatric Dentistry at ([phone]) [phone]. Children aged 4-14, please call ([phone]) [phone] to request a pediatric application. ° Dental services are provided in all areas of dental care including fillings, crowns and bridges, complete and partial dentures, implants, gum treatment, root canals, and extractions. Preventive care is also provided. Treatment is provided to both adults and children. °Patients are selected via a lottery and there is often a waiting list. °  °[facility] [address], ° ° (336) 763-8833  [email or web address].[email or web address] °  °[facility] [address], [place], [place] (336)723-1848, Ext. 123 Second and Fourth Thursday of each month, opens at 6:30 AM; Clinic ends at 9 AM.  Patients are seen on a first-come first-served basis, and a limited number are seen during each clinic.  ° °[facility] ° [address], [place], [place] (336) [phone]   Eligibility Requirements °You must have lived in [place], [place], or [place] counties for at least the last three months. °  You cannot be eligible for state or federal sponsored healthcare insurance, including Veterans Administration, Medicaid, or Medicare. °  You generally cannot be eligible for healthcare insurance through your employer.  °  How to apply: °Eligibility screenings are held every Tuesday and Wednesday afternoon from 1:00 pm until 4:00 pm. You do not need an appointment for the interview!  °  [facility] [address], [place], [place] [phone]   °[facility]  [phone]   °[facility]  [phone]   °[facility]  [phone]   ° °

## 2016-08-14 NOTE — ED Provider Notes (Signed)
CSN: [id number]     Arrival date & time 08/14/16  1008 History   None    Chief Complaint  Patient presents with  . Dental Pain   (Consider location/radiation/quality/duration/timing/severity/associated sxs/prior Treatment) HPI  Tiffany Floyd is a 35 y.o. female presenting to UC with c/o Left lower back molar tooth pain that started last night after she ate a candy bar.  Pain just started last night but pt concerned she has a cavity as it only hurts when she eats sweets.  Pain is aching and throbbing, 8/10 pain. Denies fever, n/v/d. Denies difficulty breathing or swallowing. No facial swelling. She does not currently have a dentist or PCP but notes she does have health insurance.    Past Medical History:  Diagnosis Date  . Explosive personality disorder   . Post traumatic stress disorder (PTSD)    Past Surgical History:  Procedure Laterality Date  . CERVICAL CERCLAGE  12/02/2012   Procedure: CERCLAGE CERVICAL;  Surgeon: Tereso NewcomerUgonna A Anyanwu, MD;  Location: [facility];  Service: Gynecology;  Laterality: N/A;  . CERVICAL CERCLAGE N/A 02/20/2013   Procedure:  Removal of cervical cerclage;  Surgeon: Tilda BurrowJohn V Ferguson, MD;  Location: [facility];  Service: Gynecology;  Laterality: N/A;  . CESAREAN SECTION  03/10/2012   Procedure: CESAREAN SECTION;  Surgeon: Tereso NewcomerUgonna A Anyanwu, MD;  Location: [facility];  Service: Gynecology;  Laterality: N/A;  Primary Cesarean Section Delivery Boy @ 2344,  . DILATION AND EVACUATION N/A 03/01/2013   Procedure: DILATATION AND EVACUATION with Ultrasound;  Surgeon: Adam PhenixJames G Arnold, MD;  Location: [facility];  Service: Gynecology;  Laterality: N/A;  . DILATION AND EVACUATION N/A 02/28/2013   Procedure: DILATATION AND EVACUATION;  Surgeon: Adam PhenixJames G Arnold, MD;  Location: [facility];  Service: Gynecology;  Laterality: N/A;  . EXAMINATION UNDER ANESTHESIA N/A 03/01/2013   Procedure: EXAM UNDER ANESTHESIA;  Surgeon: Adam PhenixJames G Arnold, MD;  Location: [facility];  Service: Gynecology;  Laterality: N/A;  . TUBAL  LIGATION Bilateral 02/21/2013   Procedure: POST PARTUM TUBAL LIGATION;  Surgeon: Willodean Rosenthalarolyn Harraway-Smith, MD;  Location: [facility];  Service: Gynecology;  Laterality: Bilateral;   Family History  Problem Relation Age of Onset  . Anesthesia problems Neg Hx    Social History  Substance Use Topics  . Smoking status: Never Smoker  . Smokeless tobacco: Never Used  . Alcohol use No   OB History    Gravida Para Term Preterm AB Living   5 3 1 2 2 2    SAB TAB Ectopic Multiple Live Births   1 1 0 0 3     Review of Systems  Constitutional: Negative for chills and fever.  HENT: Positive for dental problem (Left lower tooth pain). Negative for congestion, sore throat, trouble swallowing and voice change.   Gastrointestinal: Negative for nausea and vomiting.    Allergies  Review of patient's allergies indicates no known allergies.  Home Medications   Prior to Admission medications   Medication Sig Start Date End Date Taking? Authorizing Provider  amoxicillin (AMOXIL) 500 MG capsule Take 1 capsule (500 mg total) by mouth 2 (two) times daily. 08/14/16 08/24/16  Junius FinnerErin O'Malley, PA-C  HYDROcodone-acetaminophen (NORCO) 5-325 MG per tablet Take 1 tablet by mouth every 4 (four) hours as needed for severe pain. 07/15/14   Arthor CaptainAbigail Harris, PA-C  naproxen (NAPROSYN) 500 MG tablet Take 1 tablet (500 mg total) by mouth 2 (two) times daily with a meal. 07/15/14   Arthor CaptainAbigail Harris, PA-C   Meds Ordered and  Administered this Visit  Medications - No data to display  BP 143/78 (BP Location: Right Arm)   Temp 98 F (36.7 C) (Oral)   Resp 20   LMP 07/28/2016   SpO2 100%  No data found.   Physical Exam  Constitutional: She is oriented to person, place, and time. She appears well-developed and well-nourished.  HENT:  Head: Normocephalic and atraumatic.  Nose: Nose normal.  Mouth/Throat: Dental caries present.    Left lower last molar: visible cavity, tooth in tact. No gingival erythema, edema or tenderness.    Neck: Normal range of motion. Neck supple.  Cardiovascular: Normal rate.   Pulmonary/Chest: Effort normal. No stridor. No respiratory distress.  Musculoskeletal: Normal range of motion.  Neurological: She is alert and oriented to person, place, and time.  Skin: Skin is warm and dry.  Nursing note and vitals reviewed.   Urgent Care Course   Clinical Course    Procedures (including critical care time)  Labs Review Labs Reviewed - No data to display  Imaging Review No results found.    MDM   1. Pain due to dental caries    Pt c/o tooth pain due to dental carry. Symptoms started yesterday. Will start pt on Amoxicillin, home care instructions and resource guide provided. Advised pt definitive treatment has to be performed by a dentist.  Pt understanding and plans to make f/u appointment with a dentist.     Junius Finnerrin O'Malley, PA-C 08/14/16 1120

## 2016-09-03 ENCOUNTER — Encounter (HOSPITAL_COMMUNITY): Payer: Self-pay | Admitting: Emergency Medicine

## 2016-09-03 ENCOUNTER — Emergency Department (HOSPITAL_COMMUNITY)
Admission: EM | Admit: 2016-09-03 | Discharge: 2016-09-03 | Disposition: A | Discharge status: Home / Self Care | Payer: BC Managed Care – PPO | Attending: Emergency Medicine | Admitting: Emergency Medicine

## 2016-09-03 DIAGNOSIS — K029 Dental caries, unspecified: Secondary | ICD-10-CM | POA: Insufficient documentation

## 2016-09-03 DIAGNOSIS — Z791 Long term (current) use of non-steroidal anti-inflammatories (NSAID): Secondary | ICD-10-CM | POA: Diagnosis not present

## 2016-09-03 DIAGNOSIS — K0889 Other specified disorders of teeth and supporting structures: Secondary | ICD-10-CM | POA: Diagnosis present

## 2016-09-03 NOTE — ED Triage Notes (Signed)
Pt  reports recurrent toothache in l/lower mouth

## 2016-09-03 NOTE — ED Provider Notes (Signed)
[facility]-EMERGENCY DEPT Provider Note   CSN: [id number] Arrival date & time: 09/03/16  1440  By signing my name below, I, Tiffany Floyd, attest that this documentation has been prepared under the direction and in the presence of Tiffany Floyd, [employer] Electronically Signed: Soijett Floyd, ED Scribe. 09/03/16. 3:57 PM.   History   Chief Complaint Chief Complaint  Patient presents with  . Dental Pain    4 day hx of pain    HPI Tiffany Floyd is a 35 y.o. female  who presents to the Emergency Department complaining of left lower dental pain onset 1-2 weeks. Pt notes that she was recently seen at urgent care 2 weeks ago for left lower dental pain, informed that she has a cavity, and was treated with amoxil which she is still currently taking (missed a few days, so still has 2 doses left). Pt states that she is unable to be seen by a dentist due to waiting for her dental insurance to begin in October. Pt describes her left lower dental pain as 10/10, throbbing, intermittent with eating, and it doesn't radiate. She states that eating worsens her pain and denies any alleviating factors. She reports that she has tried Rx amoxil without relief, but has not tried any OTC pain medications for the relief of her symptoms. She denies gum swelling/drainage, facial swelling, fever, chills, trismus, drooling, trouble swallowing, ear pain/drainage, rhinorrhea, CP, SOB, abdominal pain, nausea, vomiting, constipation, diarrhea, numbness, tingling, weakness, dysuria, hematuria, and any other symptoms. Nonsmoker. Has not seen a dentist in 3 yrs.    The history is provided by the patient and medical records. No language interpreter was used.  Dental Pain   This is a recurrent problem. The current episode started more than 1 week ago (1-2 weeks). The problem occurs every several days. The problem has not changed since onset.The pain is at a severity of 10/10. The pain is severe. Treatments tried: Rx amoxil. The  treatment provided no relief.    Past Medical History:  Diagnosis Date  . Explosive personality disorder   . Post traumatic stress disorder (PTSD)     Patient Active Problem List   Diagnosis Date Noted  . Retained products of conception, following delivery with hemorrhage 02/28/2013  . Short cervix affecting pregnancy 11/27/2012  . History of preterm delivery, currently pregnant 11/17/2012  . Previous cesarean section 10/06/2012  . Dyspnea 08/14/2011    Past Surgical History:  Procedure Laterality Date  . CERVICAL CERCLAGE  12/02/2012   Procedure: CERCLAGE CERVICAL;  Surgeon: Tereso Newcomer, MD;  Location: [facility];  Service: Gynecology;  Laterality: N/A;  . CERVICAL CERCLAGE N/A 02/20/2013   Procedure:  Removal of cervical cerclage;  Surgeon: Tilda Burrow, MD;  Location: [facility];  Service: Gynecology;  Laterality: N/A;  . CESAREAN SECTION  03/10/2012   Procedure: CESAREAN SECTION;  Surgeon: Tereso Newcomer, MD;  Location: [facility];  Service: Gynecology;  Laterality: N/A;  Primary Cesarean Section Delivery Boy @ 2344,  . DILATION AND EVACUATION N/A 03/01/2013   Procedure: DILATATION AND EVACUATION with Ultrasound;  Surgeon: Adam Phenix, MD;  Location: [facility];  Service: Gynecology;  Laterality: N/A;  . DILATION AND EVACUATION N/A 02/28/2013   Procedure: DILATATION AND EVACUATION;  Surgeon: Adam Phenix, MD;  Location: [facility];  Service: Gynecology;  Laterality: N/A;  . EXAMINATION UNDER ANESTHESIA N/A 03/01/2013   Procedure: EXAM UNDER ANESTHESIA;  Surgeon: Adam Phenix, MD;  [facility]: [facility];  Service: Gynecology;  Laterality:  N/A;  . TUBAL LIGATION Bilateral 02/21/2013   Procedure: POST PARTUM TUBAL LIGATION;  Surgeon: Willodean Rosenthal, MD;  Location: [facility];  Service: Gynecology;  Laterality: Bilateral;    OB History    Gravida Para Term Preterm AB Living   5 3 1 2 2 2    SAB TAB Ectopic Multiple Live Births   1 1 0 0 3       Home Medications    Prior to Admission  medications   Medication Sig Start Date End Date Taking? Authorizing Provider  HYDROcodone-acetaminophen (NORCO) 5-325 MG per tablet Take 1 tablet by mouth every 4 (four) hours as needed for severe pain. 07/15/14   Arthor Captain, PA-C  naproxen (NAPROSYN) 500 MG tablet Take 1 tablet (500 mg total) by mouth 2 (two) times daily with a meal. 07/15/14   Arthor Captain, PA-C    Family History Family History  Problem Relation Age of Onset  . Anesthesia problems Neg Hx     Social History Social History  Substance Use Topics  . Smoking status: Never Smoker  . Smokeless tobacco: Never Used  . Alcohol use No     Allergies   Review of patient's allergies indicates no known allergies.   Review of Systems Review of Systems  Constitutional: Negative for chills and fever.  HENT: Positive for dental problem (left lower). Negative for drooling, ear discharge, ear pain, facial swelling, rhinorrhea, sore throat and trouble swallowing.   Respiratory: Negative for shortness of breath.   Cardiovascular: Negative for chest pain.  Gastrointestinal: Negative for abdominal pain, constipation, diarrhea, nausea and vomiting.  Genitourinary: Negative for dysuria and hematuria.  Musculoskeletal: Negative for arthralgias and myalgias.  Skin: Negative for color change.  Allergic/Immunologic: Negative for immunocompromised state.  Neurological: Negative for weakness and numbness.  Psychiatric/Behavioral: Negative for confusion.   A complete 10 system review of systems was obtained and all systems are negative except as noted in the HPI and PMH.   Physical Exam Updated Vital Signs BP 132/86 (BP Location: Right Arm)   Pulse 82   Temp 98.5 F (36.9 C) (Oral)   Resp 18   LMP 07/28/2016   SpO2 100%   Physical Exam  Constitutional: She is oriented to person, place, and time. Vital signs are normal. She appears well-developed and well-nourished.  Non-toxic appearance. No distress.  Afebrile, nontoxic,  NAD  HENT:  Head: Normocephalic and atraumatic.  Nose: Nose normal.  Mouth/Throat: Uvula is midline, oropharynx is clear and moist and mucous membranes are normal. No trismus in the jaw. Dental caries present. No dental abscesses or uvula swelling. Tonsils are 0 on the right. Tonsils are 0 on the left. No tonsillar exudate.    Left lower molar #17 decayed with caries, no dental abscess, no surrounding gingival erythema or swelling, no evidence of Ludwig's. Nose clear. Oropharynx clear and moist, without uvular swelling or deviation, no trismus or drooling, no tonsillar swelling or erythema, no exudates.   Eyes: Conjunctivae and EOM are normal. Right eye exhibits no discharge. Left eye exhibits no discharge.  Neck: Normal range of motion. Neck supple.  Cardiovascular: Normal rate and intact distal pulses.   Pulmonary/Chest: Effort normal. No respiratory distress.  Abdominal: Normal appearance. She exhibits no distension.  Musculoskeletal: Normal range of motion.  Neurological: She is alert and oriented to person, place, and time. She has normal strength. No sensory deficit.  Skin: Skin is warm, dry and intact. No rash noted.  Psychiatric: She has a normal mood and  affect. Her behavior is normal.  Nursing note and vitals reviewed.    ED Treatments / Results  DIAGNOSTIC STUDIES: Oxygen Saturation is 100% on RA, nl by my interpretation.    COORDINATION OF CARE: 3:50 PM Discussed treatment plan with pt at bedside which includes continue abx, use tylenol/motrin PRN, follow up with dentist, and pt agreed to plan.   Procedures Procedures (including critical care time)  Medications Ordered in ED Medications - No data to display   Initial Impression / Assessment and Plan / ED Course  I have reviewed the triage vital signs and the nursing notes.   Clinical Course    35 y.o. female here with Dental pain associated with dental caries but without abscess/obvious infection, with patient  afebrile, non toxic appearing and swallowing secretions well, no evidence of ludwig's. Pt seen 2wks ago and placed on amoxicillin, taken it somewhat as directed although missed a few doses so she still has 2 doses left. Discussed that I doubt we need to repeat another abx regimen, just finish the one she has, but doesn't appear to be infected at this time. I gave patient referral to dentist and stressed the importance of dental follow up for ultimate management of dental pain.  I have also discussed reasons to return immediately to the ER.  Patient expresses understanding and agrees with plan. Discussed tylenol/motrin for pain, and oragel use.    Final Clinical Impressions(s) / ED Diagnoses   Final diagnoses:  Dental caries  Pain due to dental caries    New Prescriptions New Prescriptions   No medications on file   I personally performed the services described in this documentation, which was scribed in my presence. The recorded information has been reviewed and is accurate.     Javien Tesch Camprubi-Soms, PA-C 09/03/16 1604    Benjiman CoreNathan Pickering, MD 09/04/16 0004

## 2016-09-03 NOTE — Discharge Instructions (Signed)
Apply warm compresses to jaw throughout the day. Take your antibiotic until finished. Use tylenol or motrin as needed for pain. Perform salt water swishes to help with pain/swelling. Use over the counter oragel as needed for additional relief. Follow-up with a dentist is very important for ongoing evaluation and management of recurrent dental pain, call the dentist listed above in the next 24-48 hours to schedule ongoing dental care, or use the list below to find a dentist. Return to emergency department for emergent changing or worsening symptoms.    Emergency Department Resource Guide 1) Find a Doctor and Pay Out of Pocket Although you won't have to find out who is covered by your insurance plan, it is a good idea to ask around and get recommendations. You will then need to call the office and see if the doctor you have chosen will accept you as a new patient and what types of options they offer for patients who are self-pay. Some doctors offer discounts or will set up payment plans for their patients who do not have insurance, but you will need to ask so you aren't surprised when you get to your appointment.  2) Contact Your Local Health Department Not all health departments have doctors that can see patients for sick visits, but many do, so it is worth a call to see if yours does. If you don't know where your local health department is, you can check in your phone book. The CDC also has a tool to help you locate your state's health department, and many state websites also have listings of all of their local health departments.  3) Find a Walk-in Clinic If your illness is not likely to be very severe or complicated, you may want to try a walk in clinic. These are popping up all over the country in pharmacies, drugstores, and shopping centers. They're usually staffed by nurse practitioners or physician assistants that have been trained to treat common illnesses and complaints. They're usually fairly  quick and inexpensive. However, if you have serious medical issues or chronic medical problems, these are probably not your best option.  No Primary Care Doctor: Call [facility] at  [phone] - they can help you locate a primary care doctor that  accepts your insurance, provides certain services, etc. Physician Referral Service- [phone]  Chronic Pain Problems: Organization         Address  Phone   Notes  [facility]  [phone] Patients need to be referred by their primary care doctor.   Medication Assistance: Organization         Address  Phone   Notes  [facility] [address] [place], [place] [phone] --Must be a resident of [place] -- Must have NO insurance coverage whatsoever (no Medicaid/ Medicare, etc.) -- The pt. MUST have a primary care doctor that directs their care regularly and follows them in the community   [facility]  [phone]   [employer]  [phone]     Dental Care: Organization         Address  Phone  Notes  [facility] [address], [place] [phone] Accepts children up to age 36 who are enrolled in [place] or [place] Health Choice; pregnant women with a Medicaid card; and children who have applied for Medicaid or [place] Health Choice, but were declined, whose parents can pay a reduced  fee at time of service.  [facility] Department of [facility]  [address], [place] [phone] Accepts children up to age 40 who are enrolled in [place] or [place] Health Choice; pregnant women with a Medicaid card; and children who have applied for Medicaid or [place] Health Choice, but were declined, whose parents can pay a reduced fee at time of service.  [facility]  [address] [place], [place] [phone] Patients are seen by appointment only. Walk-ins  are not accepted. [facility] will see patients 53 years of age and older. Monday - Tuesday (8am-5pm) Most Wednesdays (8:30-5pm) $30 per visit, cash only  [facility]  [address], [facility] [phone] Patients are seen by appointment only. Walk-ins are not accepted. [facility] will see patients 10 years of age and older. One Wednesday Evening (Monthly: Volunteer Based).  $30 per visit, cash only  [employer]  [phone] for adults; Children under age 18, call Graduate Pediatric Dentistry at [phone]. Children aged 69-14, please call [phone] to request a pediatric application.  Dental services are provided in all areas of dental care including fillings, crowns and bridges, complete and partial dentures, implants, gum treatment, root canals, and extractions. Preventive care is also provided. Treatment is provided to both adults and children. Patients are selected via a lottery and there is often a waiting list.   [facility] [address], [place]  [phone] [email or web address]   [facility] [address] [place], [place] [phone], Ext. [phone] Second and Fourth Thursday of each month, opens at 6:30 AM; Clinic ends at 9 AM.  Patients are seen on a first-come first-served basis, and a limited number are seen during each clinic.   [facility]  [address] [place], [place] [phone]   Eligibility Requirements You must have lived in [place], [place], or [place] counties for at least the last three months.   You cannot be eligible for state or federal sponsored [place], including [employer], [place], or [employer].   You generally cannot be eligible for healthcare insurance through your employer.    How to apply: Eligibility screenings are held every Tuesday and Wednesday afternoon from 1:00 pm until 4:00 pm. You do not need an appointment  for the interview!  [facility] [address] [place], [place] [phone]   [facility] Department  [phone]   [facility] Health Department  [phone]   [facility] Department  [phone]

## 2017-02-05 ENCOUNTER — Encounter (HOSPITAL_COMMUNITY): Payer: Self-pay | Admitting: Emergency Medicine

## 2017-02-05 ENCOUNTER — Ambulatory Visit (HOSPITAL_COMMUNITY)
Admission: EM | Admit: 2017-02-05 | Discharge: 2017-02-05 | Disposition: A | Discharge status: Home / Self Care | Payer: BC Managed Care – PPO | Attending: Family Medicine | Admitting: Family Medicine

## 2017-02-05 DIAGNOSIS — J00 Acute nasopharyngitis [common cold]: Secondary | ICD-10-CM

## 2017-02-05 DIAGNOSIS — H6982 Other specified disorders of Eustachian tube, left ear: Secondary | ICD-10-CM

## 2017-02-05 MED ORDER — BENZONATATE 100 MG PO CAPS: 100.0000 mg | ORAL_CAPSULE | Freq: Three times a day (TID) | ORAL | 0 refills | Status: DC

## 2017-02-05 MED ORDER — IPRATROPIUM BROMIDE 0.06 % NA SOLN: 2.0000 | Freq: Four times a day (QID) | NASAL | 0 refills | Status: DC

## 2017-02-05 MED ORDER — AZITHROMYCIN 250 MG PO TABS: 250.0000 mg | ORAL_TABLET | Freq: Every day | ORAL | 0 refills | Status: DC

## 2017-02-05 NOTE — ED Provider Notes (Signed)
CSN: [id number]     Arrival date & time 02/05/17  1418 History   First MD Initiated Contact with Patient 02/05/17 1453     Chief Complaint  Patient presents with  . Sore Throat  . Otalgia   (Consider location/radiation/quality/duration/timing/severity/associated sxs/prior Treatment) The history is provided by the patient.  Sore Throat  This is a new problem. The problem occurs constantly. The problem has not changed since onset. Otalgia    Past Medical History:  Diagnosis Date  . Explosive personality disorder   . Post traumatic stress disorder (PTSD)    Past Surgical History:  Procedure Laterality Date  . CERVICAL CERCLAGE  12/02/2012   Procedure: CERCLAGE CERVICAL;  Surgeon: Tereso Newcomer, MD;  [facility]: [facility];  Service: Gynecology;  Laterality: N/A;  . CERVICAL CERCLAGE N/A 02/20/2013   Procedure:  Removal of cervical cerclage;  Surgeon: Tilda Burrow, MD;  Location: [facility];  Service: Gynecology;  Laterality: N/A;  . CESAREAN SECTION  03/10/2012   Procedure: CESAREAN SECTION;  Surgeon: Tereso Newcomer, MD;  Location: [facility];  Service: Gynecology;  Laterality: N/A;  Primary Cesarean Section Delivery Boy @ 2344,  . DILATION AND EVACUATION N/A 03/01/2013   Procedure: DILATATION AND EVACUATION with Ultrasound;  Surgeon: Adam Phenix, MD;  Location: [facility];  Service: Gynecology;  Laterality: N/A;  . DILATION AND EVACUATION N/A 02/28/2013   Procedure: DILATATION AND EVACUATION;  Surgeon: Adam Phenix, MD;  [facility];  Service: Gynecology;  Laterality: N/A;  . EXAMINATION UNDER ANESTHESIA N/A 03/01/2013   Procedure: EXAM UNDER ANESTHESIA;  Surgeon: Adam Phenix, MD;  [facility];  Service: Gynecology;  Laterality: N/A;  . TUBAL LIGATION Bilateral 02/21/2013   Procedure: POST PARTUM TUBAL LIGATION;  Surgeon: Willodean Rosenthal, MD;  [facility]: [facility];  Service: Gynecology;  Laterality: Bilateral;   Family History  Problem Relation Age of Onset  . Adopted: Yes  .  Anesthesia problems Neg Hx    Social History  Substance Use Topics  . Smoking status: Never Smoker  . Smokeless tobacco: Never Used  . Alcohol use No   OB History    Gravida Para Term Preterm AB Living   5 3 1 2 2 2    SAB TAB Ectopic Multiple Live Births   1 1 0 0 3     Review of Systems  Constitutional: Negative.   HENT: Positive for ear pain.   Eyes: Negative.   Respiratory: Negative.   Cardiovascular: Negative.   Gastrointestinal: Negative.   Endocrine: Negative.   Genitourinary: Negative.   Musculoskeletal: Negative.   Skin: Negative.   Allergic/Immunologic: Negative.   Neurological: Negative.   Hematological: Negative.   Psychiatric/Behavioral: Negative.     Allergies  Patient has no known allergies.  Home Medications   Prior to Admission medications   Medication Sig Start Date End Date Taking? Authorizing Provider  azithromycin (ZITHROMAX) 250 MG tablet Take 1 tablet (250 mg total) by mouth daily. Take first 2 tablets together, then 1 every day until finished. 02/05/17   Deatra Canter, FNP  benzonatate (TESSALON) 100 MG capsule Take 1 capsule (100 mg total) by mouth every 8 (eight) hours. 02/05/17   Deatra Canter, FNP  HYDROcodone-acetaminophen (NORCO) 5-325 MG per tablet Take 1 tablet by mouth every 4 (four) hours as needed for severe pain. 07/15/14   Arthor Captain, PA-C  ipratropium (ATROVENT) 0.06 % nasal spray Place 2 sprays into both nostrils 4 (four) times daily. 02/05/17  Deatra CanterWilliam J Oxford, FNP  naproxen (NAPROSYN) 500 MG tablet Take 1 tablet (500 mg total) by mouth 2 (two) times daily with a meal. 07/15/14   Arthor CaptainAbigail Harris, PA-C   Meds Ordered and Administered this Visit  Medications - No data to display  BP 131/79   Pulse 72   Temp 99.1 F (37.3 C) (Oral)   Resp 16   Ht 5\' 4"  (1.626 m)   Wt 165 lb (74.8 kg)   LMP 01/22/2017   SpO2 100%   BMI 28.32 kg/m  No data found.   Physical Exam  Constitutional: She is oriented to person, place, and  time. She appears well-developed and well-nourished.  HENT:  Head: Normocephalic and atraumatic.  Right Ear: External ear normal.  Left Ear: External ear normal.  Mouth/Throat: Oropharynx is clear and moist.  Eyes: Conjunctivae and EOM are normal. Pupils are equal, round, and reactive to light.  Neck: Normal range of motion. Neck supple.  Cardiovascular: Normal rate, regular rhythm and normal heart sounds.   Pulmonary/Chest: Effort normal and breath sounds normal.  Neurological: She is alert and oriented to person, place, and time.  Nursing note and vitals reviewed.   Urgent Care Course     Procedures (including critical care time)  Labs Review Labs Reviewed - No data to display  Imaging Review No results found.   Visual Acuity Review  Right Eye Distance:   Left Eye Distance:   Bilateral Distance:    Right Eye Near:   Left Eye Near:    Bilateral Near:         MDM   1. Acute nasopharyngitis   2. ETD (Eustachian tube dysfunction), left    zpak Tessalon Atrovent nasal spray  Push po fluids, rest, tylenol and motrin otc prn as directed for fever, arthralgias, and myalgias.  Follow up prn if sx's continue or persist.    Deatra CanterWilliam J Oxford, FNP 02/05/17 1515

## 2017-02-05 NOTE — ED Triage Notes (Signed)
PT reports sore throat for a week with ear pain that started today.

## 2017-03-29 ENCOUNTER — Encounter: Payer: Self-pay | Admitting: Student

## 2017-03-29 ENCOUNTER — Other Ambulatory Visit (HOSPITAL_COMMUNITY)
Admission: RE | Admit: 2017-03-29 | Discharge: 2017-03-29 | Disposition: A | Discharge status: Home / Self Care | Payer: BC Managed Care – PPO | Source: Ambulatory Visit | Attending: Student | Admitting: Student

## 2017-03-29 ENCOUNTER — Ambulatory Visit (INDEPENDENT_AMBULATORY_CARE_PROVIDER_SITE_OTHER): Payer: BC Managed Care – PPO | Admitting: Student

## 2017-03-29 VITALS — BP 120/88 | HR 68 | Ht 65.0 in | Wt 170.0 lb

## 2017-03-29 DIAGNOSIS — N898 Other specified noninflammatory disorders of vagina: Secondary | ICD-10-CM

## 2017-03-29 DIAGNOSIS — Z01419 Encounter for gynecological examination (general) (routine) without abnormal findings: Secondary | ICD-10-CM | POA: Diagnosis not present

## 2017-03-29 NOTE — Progress Notes (Signed)
GYNECOLOGY CLINIC ANNUAL PREVENTATIVE CARE ENCOUNTER NOTE  Subjective:   Tiffany Floyd is a 36 y.o. [phone] female here for a routine annual gynecologic exam.  Current complaints: none.   Denies abnormal vaginal bleeding, discharge, pelvic pain, problems with intercourse or other gynecologic concerns.  Patient has not been sexually active since her delivery in 2014.    Gynecologic History Patient's last menstrual period was 03/17/2017 (exact date). Contraception: tubal ligation Last Pap: 2014. Results were: normal (per patient) Regular monthly menstrual cycle with light 3 day flow. No intermenstrual bleeding.  Performs self breast exams & has not noticed any concerning masses or breast changes.   Obstetric History OB History  Gravida Para Term Preterm AB Living  SAB TAB Ectopic Multiple Live Births  1 1 0 0 3    # Outcome Date GA Lbr Len/2nd Weight Sex Delivery Anes PTL Lv  5 Preterm 02/20/13 [redacted]w[redacted]d 00:24 / 00:15 3 lb 9.5 oz (1.63 kg) M VBAC EPI  LIV  4 Preterm 03/13/12 [redacted]w[redacted]d   M CS-LTranv  Y ND     Birth Comments: PROM, had PP Depression  3 Term 03/24/05 [redacted]w[redacted]d  5 lb (2.268 kg) F Vag-Spont None Y LIV     Birth Comments: PROM  2 SAB 2006 [redacted]w[redacted]d   F      1 TAB               Past Medical History:  Diagnosis Date  . Explosive personality disorder   . Post traumatic stress disorder (PTSD)     Past Surgical History:  Procedure Laterality Date  . CERVICAL CERCLAGE  12/02/2012   Procedure: CERCLAGE CERVICAL;  Surgeon: Tereso Newcomer, MD;  [facility];  Service: Gynecology;  Laterality: N/A;  . CERVICAL CERCLAGE N/A 02/20/2013   Procedure:  Removal of cervical cerclage;  Surgeon: Tilda Burrow, MD;  [facility];  Service: Gynecology;  Laterality: N/A;  . CESAREAN SECTION  03/10/2012   Procedure: CESAREAN SECTION;  Surgeon: Tereso Newcomer, MD;  [facility];  Service: Gynecology;  Laterality: N/A;  Primary Cesarean Section Delivery Boy @ 2344,  .  DILATION AND EVACUATION N/A 03/01/2013   Procedure: DILATATION AND EVACUATION with Ultrasound;  Surgeon: Adam Phenix, MD;  [facility];  Service: Gynecology;  Laterality: N/A;  . DILATION AND EVACUATION N/A 02/28/2013   Procedure: DILATATION AND EVACUATION;  Surgeon: Adam Phenix, MD;  [facility];  Service: Gynecology;  Laterality: N/A;  . EXAMINATION UNDER ANESTHESIA N/A 03/01/2013   Procedure: EXAM UNDER ANESTHESIA;  Surgeon: Adam Phenix, MD;  [facility];  Service: Gynecology;  Laterality: N/A;  . TUBAL LIGATION Bilateral 02/21/2013   Procedure: POST PARTUM TUBAL LIGATION;  Surgeon: Willodean Rosenthal, MD;  [facility];  Service: Gynecology;  Laterality: Bilateral;    Current Outpatient Prescriptions on File Prior to Visit  Medication Sig Dispense Refill  . [DISCONTINUED] PARoxetine (PAXIL) 30 MG tablet Take 30 mg by mouth every morning.       No current facility-administered medications on file prior to visit.     No Known Allergies  Social History   Social History  . Marital status: Single    Spouse name: N/A  . Number of children: N/A  . Years of education: N/A   Occupational History  . Not on file.   Social History Main Topics  . Smoking status: Never Smoker  . Smokeless tobacco: Never  Used  . Alcohol use No  . Drug use: No  . Sexual activity: Yes    Birth control/ protection: Surgical   Other Topics Concern  . Not on file   Social History Narrative  . No narrative on file    Family History  Problem Relation Age of Onset  . Adopted: Yes  . Anesthesia problems Neg Hx     The following portions of the patient's history were reviewed and updated as appropriate: allergies, current medications, past family history, past medical history, past social history, past surgical history and problem list.  Review of Systems Pertinent items are noted in HPI.   Objective:  BP 120/88   Pulse 68   Ht  (1.651 m)   Wt 170 lb (77.1 kg)    LMP 03/17/2017 (Exact Date)   BMI 28.29 kg/m  CONSTITUTIONAL: Well-developed, well-nourished female in no acute distress.  HENT:  Normocephalic, atraumatic  EYES: Conjunctivae and EOM are normal. No scleral icterus.  NECK: Normal range of motion, supple, no masses.  Normal thyroid.  SKIN: Skin is warm and dry. No rash noted. Not diaphoretic. No erythema. No pallor. PSYCHIATRIC: Normal mood and affect. Normal behavior. Normal judgment and thought content. CARDIOVASCULAR: Normal heart rate noted, regular rhythm RESPIRATORY: Clear to auscultation bilaterally. Effort and breath sounds normal, no problems with respiration noted. BREASTS: Symmetric in size. No masses, skin changes, nipple drainage, or lymphadenopathy. ABDOMEN: Soft, normal bowel sounds, no distention noted.  No tenderness, rebound or guarding.  PELVIC: Minimal amount of clumpy white discharge (wet prep collected). Normal appearing external genitalia; normal appearing vaginal mucosa and cervix. Pap smear obtained.  Normal uterine size, no other palpable masses, no uterine or adnexal tenderness. MUSCULOSKELETAL: Normal range of motion. No tenderness.  No cyanosis, clubbing, or edema.  2+ distal pulses.   Assessment:  Annual gynecologic examination with pap smear   Plan:  Will follow up results of pap smear and manage accordingly. Mammogram not indicated d/t age Routine preventative health maintenance measures emphasized. Please refer to After Visit Summary for other counseling recommendations.    Judeth Horn, NP

## 2017-03-29 NOTE — Patient Instructions (Signed)
Health Maintenance, Female Adopting a healthy lifestyle and getting preventive care can go a long way to promote health and wellness. Talk with your health care provider about what schedule of regular examinations is right for you. This is a good chance for you to check in with your provider about disease prevention and staying healthy. In between checkups, there are plenty of things you can do on your own. Experts have done a lot of research about which lifestyle changes and preventive measures are most likely to keep you healthy. Ask your health care provider for more information. Weight and diet Eat a healthy diet  Be sure to include plenty of vegetables, fruits, low-fat dairy products, and lean protein.  Do not eat a lot of foods high in solid fats, added sugars, or salt.  Get regular exercise. This is one of the most important things you can do for your health.  Most adults should exercise for at least 150 minutes each week. The exercise should increase your heart rate and make you sweat (moderate-intensity exercise).  Most adults should also do strengthening exercises at least twice a week. This is in addition to the moderate-intensity exercise. Maintain a healthy weight  Body mass index (BMI) is a measurement that can be used to identify possible weight problems. It estimates body fat based on height and weight. Your health care provider can help determine your BMI and help you achieve or maintain a healthy weight.  For females 76 years of age and older:  A BMI below 18.5 is considered underweight.  A BMI of 18.5 to 24.9 is normal.  A BMI of 25 to 29.9 is considered overweight.  A BMI of 30 and above is considered obese. Watch levels of cholesterol and blood lipids  You should start having your blood tested for lipids and cholesterol at 37 years of age, then have this test every 5 years.  You may need to have your cholesterol levels checked more often if:  Your lipid or  cholesterol levels are high.  You are older than 37 years of age.  You are at high risk for heart disease. Cancer screening Lung Cancer  Lung cancer screening is recommended for adults 64-42 years old who are at high risk for lung cancer because of a history of smoking.  A yearly low-dose CT scan of the lungs is recommended for people who:  Currently smoke.  Have quit within the past 15 years.  Have at least a 30-pack-year history of smoking. A pack year is smoking an average of one pack of cigarettes a day for 1 year.  Yearly screening should continue until it has been 15 years since you quit.  Yearly screening should stop if you develop a health problem that would prevent you from having lung cancer treatment. Breast Cancer  Practice breast self-awareness. This means understanding how your breasts normally appear and feel.  It also means doing regular breast self-exams. Let your health care provider know about any changes, no matter how small.  If you are in your 20s or 30s, you should have a clinical breast exam (CBE) by a health care provider every 1-3 years as part of a regular health exam.  If you are 34 or older, have a CBE every year. Also consider having a breast X-ray (mammogram) every year.  If you have a family history of breast cancer, talk to your health care provider about genetic screening.  If you are at high risk for breast cancer, talk  to your health care provider about having an MRI and a mammogram every year.  Breast cancer gene (BRCA) assessment is recommended for women who have family members with BRCA-related cancers. BRCA-related cancers include:  Breast.  Ovarian.  Tubal.  Peritoneal cancers.  Results of the assessment will determine the need for genetic counseling and BRCA1 and BRCA2 testing. Cervical Cancer  Your health care provider may recommend that you be screened regularly for cancer of the pelvic organs (ovaries, uterus, and vagina).  This screening involves a pelvic examination, including checking for microscopic changes to the surface of your cervix (Pap test). You may be encouraged to have this screening done every 3 years, beginning at age 24.  For women ages 66-65, health care providers may recommend pelvic exams and Pap testing every 3 years, or they may recommend the Pap and pelvic exam, combined with testing for human papilloma virus (HPV), every 5 years. Some types of HPV increase your risk of cervical cancer. Testing for HPV may also be done on women of any age with unclear Pap test results.  Other health care providers may not recommend any screening for nonpregnant women who are considered low risk for pelvic cancer and who do not have symptoms. Ask your health care provider if a screening pelvic exam is right for you.  If you have had past treatment for cervical cancer or a condition that could lead to cancer, you need Pap tests and screening for cancer for at least 20 years after your treatment. If Pap tests have been discontinued, your risk factors (such as having a new sexual partner) need to be reassessed to determine if screening should resume. Some women have medical problems that increase the chance of getting cervical cancer. In these cases, your health care provider may recommend more frequent screening and Pap tests. Colorectal Cancer  This type of cancer can be detected and often prevented.  Routine colorectal cancer screening usually begins at 37 years of age and continues through 37 years of age.  Your health care provider may recommend screening at an earlier age if you have risk factors for colon cancer.  Your health care provider may also recommend using home test kits to check for hidden blood in the stool.  A small camera at the end of a tube can be used to examine your colon directly (sigmoidoscopy or colonoscopy). This is done to check for the earliest forms of colorectal cancer.  Routine  screening usually begins at age 41.  Direct examination of the colon should be repeated every 5-10 years through 37 years of age. However, you may need to be screened more often if early forms of precancerous polyps or small growths are found. Skin Cancer  Check your skin from head to toe regularly.  Tell your health care provider about any new moles or changes in moles, especially if there is a change in a mole's shape or color.  Also tell your health care provider if you have a mole that is larger than the size of a pencil eraser.  Always use sunscreen. Apply sunscreen liberally and repeatedly throughout the day.  Protect yourself by wearing long sleeves, pants, a wide-brimmed hat, and sunglasses whenever you are outside. Heart disease, diabetes, and high blood pressure  High blood pressure causes heart disease and increases the risk of stroke. High blood pressure is more likely to develop in:  People who have blood pressure in the high end of the normal range (130-139/85-89 mm Hg).  People who are overweight or obese.  People who are African American.  If you are 59-24 years of age, have your blood pressure checked every 3-5 years. If you are 34 years of age or older, have your blood pressure checked every year. You should have your blood pressure measured twice-once when you are at a hospital or clinic, and once when you are not at a hospital or clinic. Record the average of the two measurements. To check your blood pressure when you are not at a hospital or clinic, you can use:  An automated blood pressure machine at a pharmacy.  A home blood pressure monitor.  If you are between 29 years and 60 years old, ask your health care provider if you should take aspirin to prevent strokes.  Have regular diabetes screenings. This involves taking a blood sample to check your fasting blood sugar level.  If you are at a normal weight and have a low risk for diabetes, have this test once  every three years after 37 years of age.  If you are overweight and have a high risk for diabetes, consider being tested at a younger age or more often. Preventing infection Hepatitis B  If you have a higher risk for hepatitis B, you should be screened for this virus. You are considered at high risk for hepatitis B if:  You were born in a country where hepatitis B is common. Ask your health care provider which countries are considered high risk.  Your parents were born in a high-risk country, and you have not been immunized against hepatitis B (hepatitis B vaccine).  You have HIV or AIDS.  You use needles to inject street drugs.  You live with someone who has hepatitis B.  You have had sex with someone who has hepatitis B.  You get hemodialysis treatment.  You take certain medicines for conditions, including cancer, organ transplantation, and autoimmune conditions. Hepatitis C  Blood testing is recommended for:  Everyone born from [birth year] through 1966.  Anyone with known risk factors for hepatitis C. Sexually transmitted infections (STIs)  You should be screened for sexually transmitted infections (STIs) including gonorrhea and chlamydia if:  You are sexually active and are younger than 37 years of age.  You are older than 37 years of age and your health care provider tells you that you are at risk for this type of infection.  Your sexual activity has changed since you were last screened and you are at an increased risk for chlamydia or gonorrhea. Ask your health care provider if you are at risk.  If you do not have HIV, but are at risk, it may be recommended that you take a prescription medicine daily to prevent HIV infection. This is called pre-exposure prophylaxis (PrEP). You are considered at risk if:  You are sexually active and do not regularly use condoms or know the HIV status of your partner(s).  You take drugs by injection.  You are sexually active with a partner  who has HIV. Talk with your health care provider about whether you are at high risk of being infected with HIV. If you choose to begin PrEP, you should first be tested for HIV. You should then be tested every 3 months for as long as you are taking PrEP. Pregnancy  If you are premenopausal and you may become pregnant, ask your health care provider about preconception counseling.  If you may become pregnant, take 400 to 800 micrograms (mcg) of folic acid  every day.  If you want to prevent pregnancy, talk to your health care provider about birth control (contraception). Osteoporosis and menopause  Osteoporosis is a disease in which the bones lose minerals and strength with aging. This can result in serious bone fractures. Your risk for osteoporosis can be identified using a bone density scan.  If you are 102 years of age or older, or if you are at risk for osteoporosis and fractures, ask your health care provider if you should be screened.  Ask your health care provider whether you should take a calcium or vitamin D supplement to lower your risk for osteoporosis.  Menopause may have certain physical symptoms and risks.  Hormone replacement therapy may reduce some of these symptoms and risks. Talk to your health care provider about whether hormone replacement therapy is right for you. Follow these instructions at home:  Schedule regular health, dental, and eye exams.  Stay current with your immunizations.  Do not use any tobacco products including cigarettes, chewing tobacco, or electronic cigarettes.  If you are pregnant, do not drink alcohol.  If you are breastfeeding, limit how much and how often you drink alcohol.  Limit alcohol intake to no more than 1 drink per day for nonpregnant women. One drink equals 12 ounces of beer, 5 ounces of wine, or 1 ounces of hard liquor.  Do not use street drugs.  Do not share needles.  Ask your health care provider for help if you need support  or information about quitting drugs.  Tell your health care provider if you often feel depressed.  Tell your health care provider if you have ever been abused or do not feel safe at home. This information is not intended to replace advice given to you by your health care provider. Make sure you discuss any questions you have with your health care provider. Document Released: 06/29/2011 Document Revised: 05/21/2016 Document Reviewed: 09/17/2015 Elsevier Interactive Patient Education  2017 [employer].

## 2017-03-30 LAB — CERVICOVAGINAL ANCILLARY ONLY
Bacterial vaginitis: NEGATIVE
Candida vaginitis: NEGATIVE
Trichomonas: NEGATIVE

## 2017-03-31 LAB — CYTOLOGY - PAP
Chlamydia: NEGATIVE
Diagnosis: NEGATIVE
HPV: NOT DETECTED
Neisseria Gonorrhea: NEGATIVE
Trichomonas: NEGATIVE

## 2017-04-01 ENCOUNTER — Telehealth: Payer: Self-pay

## 2017-04-01 NOTE — Telephone Encounter (Signed)
Per Judeth Horn, pt needs to be notified of normal pap smear and to receive next pap in 3 years. Notified pt of normal results and f/u. Patient stated understanding with no further questions.

## 2019-07-20 DIAGNOSIS — F411 Generalized anxiety disorder: Secondary | ICD-10-CM | POA: Insufficient documentation

## 2019-07-20 DIAGNOSIS — K5904 Chronic idiopathic constipation: Secondary | ICD-10-CM | POA: Insufficient documentation

## 2019-07-20 DIAGNOSIS — J302 Other seasonal allergic rhinitis: Secondary | ICD-10-CM | POA: Insufficient documentation

## 2019-11-18 ENCOUNTER — Other Ambulatory Visit: Payer: Self-pay

## 2019-11-18 ENCOUNTER — Encounter (HOSPITAL_COMMUNITY): Payer: Self-pay

## 2019-11-18 ENCOUNTER — Ambulatory Visit (HOSPITAL_COMMUNITY)
Admission: EM | Admit: 2019-11-18 | Discharge: 2019-11-18 | Disposition: A | Discharge status: Home / Self Care | Payer: Medicaid Other | Attending: Physician Assistant | Admitting: Physician Assistant

## 2019-11-18 DIAGNOSIS — S39012A Strain of muscle, fascia and tendon of lower back, initial encounter: Secondary | ICD-10-CM

## 2019-11-18 MED ORDER — IBUPROFEN 800 MG PO TABS: 800.0000 mg | ORAL_TABLET | Freq: Three times a day (TID) | ORAL | 0 refills | Status: DC | PRN

## 2019-11-18 NOTE — Discharge Instructions (Signed)
Return if any problems.
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
## 2020-06-04 ENCOUNTER — Ambulatory Visit: Payer: Medicaid Other | Admitting: Student

## 2020-06-28 ENCOUNTER — Encounter: Payer: Self-pay | Admitting: Medical

## 2020-06-28 ENCOUNTER — Ambulatory Visit (INDEPENDENT_AMBULATORY_CARE_PROVIDER_SITE_OTHER): Payer: Medicaid Other | Admitting: Medical

## 2020-06-28 ENCOUNTER — Other Ambulatory Visit (HOSPITAL_COMMUNITY)
Admission: RE | Admit: 2020-06-28 | Discharge: 2020-06-28 | Disposition: A | Discharge status: Home / Self Care | Payer: Medicaid Other | Source: Ambulatory Visit | Attending: Medical | Admitting: Medical

## 2020-06-28 ENCOUNTER — Other Ambulatory Visit: Payer: Self-pay

## 2020-06-28 VITALS — BP 128/85 | HR 76 | Ht 63.0 in | Wt 194.0 lb

## 2020-06-28 DIAGNOSIS — Z01419 Encounter for gynecological examination (general) (routine) without abnormal findings: Secondary | ICD-10-CM | POA: Diagnosis present

## 2020-06-28 DIAGNOSIS — Z1231 Encounter for screening mammogram for malignant neoplasm of breast: Secondary | ICD-10-CM | POA: Diagnosis not present

## 2020-06-28 DIAGNOSIS — A599 Trichomoniasis, unspecified: Secondary | ICD-10-CM

## 2020-06-28 NOTE — Patient Instructions (Signed)
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
## 2020-06-29 LAB — RPR: RPR Ser Ql: NONREACTIVE

## 2020-06-29 LAB — HEPATITIS C ANTIBODY: Hep C Virus Ab: <0.1 s/co ratio (ref 0.0–0.9)

## 2020-06-29 LAB — HIV ANTIBODY (ROUTINE TESTING W REFLEX): HIV Screen 4th Generation wRfx: NONREACTIVE

## 2020-06-29 LAB — HEPATITIS B SURFACE ANTIGEN: Hepatitis B Surface Ag: NEGATIVE

## 2020-07-02 LAB — CYTOLOGY - PAP
Chlamydia: NEGATIVE
Comment: NEGATIVE
Comment: NEGATIVE
Comment: NEGATIVE
Comment: NORMAL
Diagnosis: NEGATIVE
High risk HPV: NEGATIVE
Neisseria Gonorrhea: NEGATIVE
Trichomonas: POSITIVE — AB

## 2020-07-03 ENCOUNTER — Telehealth (INDEPENDENT_AMBULATORY_CARE_PROVIDER_SITE_OTHER): Payer: Medicaid Other | Admitting: Lactation Services

## 2020-07-03 DIAGNOSIS — A599 Trichomoniasis, unspecified: Secondary | ICD-10-CM

## 2020-07-03 MED ORDER — METRONIDAZOLE 500 MG PO TABS: 2000.0000 mg | ORAL_TABLET | Freq: Once | ORAL | 0 refills | Status: AC

## 2020-07-03 NOTE — Addendum Note (Signed)
Addended by: Marny Lowenstein on: 07/03/2020 08:19 AM   Modules accepted: Orders

## 2020-07-03 NOTE — Telephone Encounter (Addendum)
---   Message from Marny Lowenstein, PA-C sent at 07/03/2020  8:19 AM EDT ----- Normal pap  +trichomonas. Rx sent. Please inform patient by phone and advise partner treatment  Vonzella Nipple, PA-C 07/03/2020 8:18 AM    Called patient to inform her of lab results. She was informed partner needs to call [facility] to be tested and treated. Advised patient to abstain from sexual intercourse for 2 weeks after both partners are treated. She is aware of prescription and to take all pills at one time.  Patient voiced understanding.

## 2020-07-19 ENCOUNTER — Other Ambulatory Visit: Payer: Self-pay

## 2020-07-19 ENCOUNTER — Ambulatory Visit (INDEPENDENT_AMBULATORY_CARE_PROVIDER_SITE_OTHER): Payer: Medicaid Other | Admitting: *Deleted

## 2020-07-19 ENCOUNTER — Other Ambulatory Visit (HOSPITAL_COMMUNITY)
Admission: RE | Admit: 2020-07-19 | Discharge: 2020-07-19 | Disposition: A | Discharge status: Home / Self Care | Payer: Medicaid Other | Source: Ambulatory Visit | Attending: Family Medicine | Admitting: Family Medicine

## 2020-07-19 VITALS — BP 122/82 | HR 78 | Ht 63.0 in | Wt 194.0 lb

## 2020-07-19 DIAGNOSIS — A599 Trichomoniasis, unspecified: Secondary | ICD-10-CM

## 2020-07-19 NOTE — Progress Notes (Signed)
Chart reviewed for nurse visit. Agree with plan of care.   Rolm Bookbinder, [place] 07/19/2020 9:50 AM

## 2020-07-19 NOTE — Progress Notes (Signed)
Pt presents for TOC following treatment for +Trichomonas infection. She denies having pelvic pain or vaginal discharge. Pt reports that she has not had intercourse since receiving treatment and that her partner has received treatment as well. Self swab obtained and pt was advised she will be notified if results via MyChart. She voiced understanding.

## 2020-07-22 LAB — CERVICOVAGINAL ANCILLARY ONLY
Bacterial Vaginitis (gardnerella): POSITIVE — AB
Candida Glabrata: NEGATIVE
Candida Vaginitis: NEGATIVE
Chlamydia: NEGATIVE
Comment: NEGATIVE
Comment: NEGATIVE
Comment: NEGATIVE
Comment: NEGATIVE
Comment: NEGATIVE
Comment: NORMAL
Neisseria Gonorrhea: NEGATIVE
Trichomonas: NEGATIVE

## 2020-07-23 ENCOUNTER — Encounter: Payer: Self-pay | Admitting: Medical

## 2020-07-23 ENCOUNTER — Other Ambulatory Visit: Payer: Self-pay

## 2020-07-23 DIAGNOSIS — B9689 Other specified bacterial agents as the cause of diseases classified elsewhere: Secondary | ICD-10-CM

## 2020-07-23 MED ORDER — METRONIDAZOLE 500 MG PO TABS: 500.0000 mg | ORAL_TABLET | Freq: Two times a day (BID) | ORAL | 0 refills | Status: DC

## 2020-08-02 ENCOUNTER — Other Ambulatory Visit: Payer: Self-pay

## 2020-08-02 ENCOUNTER — Ambulatory Visit
Admission: RE | Admit: 2020-08-02 | Discharge: 2020-08-02 | Disposition: A | Discharge status: Home / Self Care | Payer: Medicaid Other | Source: Ambulatory Visit | Attending: Medical | Admitting: Medical

## 2020-08-02 DIAGNOSIS — Z1231 Encounter for screening mammogram for malignant neoplasm of breast: Secondary | ICD-10-CM

## 2020-09-13 ENCOUNTER — Other Ambulatory Visit: Payer: Medicaid Other

## 2020-09-13 ENCOUNTER — Other Ambulatory Visit: Payer: Self-pay

## 2020-09-13 DIAGNOSIS — Z20822 Contact with and (suspected) exposure to covid-19: Secondary | ICD-10-CM

## 2020-09-16 LAB — NOVEL CORONAVIRUS, NAA: SARS-CoV-2, NAA: NOT DETECTED

## 2021-04-28 ENCOUNTER — Other Ambulatory Visit: Payer: Self-pay | Admitting: Medical

## 2021-04-28 DIAGNOSIS — Z1231 Encounter for screening mammogram for malignant neoplasm of breast: Secondary | ICD-10-CM

## 2021-07-17 ENCOUNTER — Ambulatory Visit: Payer: Medicaid Other | Admitting: Family Medicine

## 2021-08-04 ENCOUNTER — Ambulatory Visit
Admission: RE | Admit: 2021-08-04 | Discharge: 2021-08-04 | Disposition: A | Discharge status: Home / Self Care | Payer: Medicaid Other | Source: Ambulatory Visit | Attending: Medical | Admitting: Medical

## 2021-08-04 ENCOUNTER — Other Ambulatory Visit: Payer: Self-pay

## 2021-08-04 ENCOUNTER — Encounter: Payer: Self-pay | Admitting: Obstetrics and Gynecology

## 2021-08-04 ENCOUNTER — Ambulatory Visit: Payer: Medicaid Other | Admitting: Obstetrics and Gynecology

## 2021-08-04 ENCOUNTER — Ambulatory Visit (INDEPENDENT_AMBULATORY_CARE_PROVIDER_SITE_OTHER): Payer: Medicaid Other | Admitting: Obstetrics and Gynecology

## 2021-08-04 DIAGNOSIS — Z01419 Encounter for gynecological examination (general) (routine) without abnormal findings: Secondary | ICD-10-CM | POA: Diagnosis not present

## 2021-08-04 DIAGNOSIS — Z1231 Encounter for screening mammogram for malignant neoplasm of breast: Secondary | ICD-10-CM

## 2021-08-04 DIAGNOSIS — Z113 Encounter for screening for infections with a predominantly sexual mode of transmission: Secondary | ICD-10-CM | POA: Diagnosis not present

## 2021-08-04 NOTE — Progress Notes (Signed)
GYNECOLOGY ANNUAL PREVENTATIVE CARE ENCOUNTER NOTE  History:     Tiffany Floyd is a 40 y.o. [phone] female here for a routine annual gynecologic exam.  Current complaints: none.   Denies abnormal vaginal bleeding, discharge, pelvic pain, problems with intercourse or other gynecologic concerns.   She still notes regular menses lasting 4 days.   Gynecologic History Patient's last menstrual period was 07/21/2021 (exact date). Contraception: tubal ligation Last Pap: 06/28/20. Results were: normal with negative HPV Last mammogram: 08/04/21. Results were: pending  Obstetric History OB History  Gravida Para Term Preterm AB Living  5 3 1 2 2 2   SAB IAB Ectopic Multiple Live Births  1 1 0 0 3    # Outcome Date GA Lbr Len/2nd Weight Sex Delivery Anes PTL Lv  5 Preterm 02/20/13 [redacted]w[redacted]d 00:24 / 00:15 3 lb 9.5 oz (1.63 kg) M VBAC EPI  LIV  4 Preterm 03/13/12 [redacted]w[redacted]d   M CS-LTranv  Y ND     Birth Comments: PROM, had PP Depression  3 Term 03/24/05 [redacted]w[redacted]d  5 lb (2.268 kg) F Vag-Spont None Y LIV     Birth Comments: PROM  2 SAB 2006 [redacted]w[redacted]d   F      1 IAB             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(HCC)    Post traumatic stress disorder (PTSD)     Past Surgical History:  Procedure Laterality Date   CERVICAL CERCLAGE  12/02/2012   Procedure: CERCLAGE CERVICAL;  Surgeon: 14/05/2012, MD;  Location: [facility];  Service: Gynecology;  Laterality: N/A;   CERVICAL CERCLAGE N/A 02/20/2013   Procedure:  Removal of cervical cerclage;  Surgeon: 02/22/2013, MD;  Location: [facility];  Service: Gynecology;  Laterality: N/A;   CESAREAN SECTION  03/10/2012   Procedure: CESAREAN SECTION;  Surgeon: 03/12/2012, MD;  Location: [facility];  Service: Gynecology;  Laterality: N/A;  Primary Cesarean Section Delivery Boy @ 2344,   DILATION AND EVACUATION N/A 03/01/2013   Procedure: DILATATION AND EVACUATION with Ultrasound;  Surgeon: 05/01/2013, MD;  Location: [facility];   Service: Gynecology;  Laterality: N/A;   DILATION AND EVACUATION N/A 02/28/2013   Procedure: DILATATION AND EVACUATION;  Surgeon: 04/30/2013, MD;  Location: [facility];  Service: Gynecology;  Laterality: N/A;   EXAMINATION UNDER ANESTHESIA N/A 03/01/2013   Procedure: EXAM UNDER ANESTHESIA;  Surgeon: 05/01/2013, MD;  Location: [facility];  Service: Gynecology;  Laterality: N/A;   TUBAL LIGATION Bilateral 02/21/2013   Procedure: POST PARTUM TUBAL LIGATION;  Surgeon: 02/23/2013, MD;  Location: [facility];  Service: Gynecology;  Laterality: Bilateral;    Current Outpatient Medications on File Prior to Visit  Medication Sig Dispense Refill   hydrOXYzine (ATARAX/VISTARIL) 25 MG tablet Take 25 mg by mouth as needed.     loratadine (CLARITIN) 10 MG tablet Take 10 mg by mouth daily.     omeprazole (PRILOSEC) 20 MG capsule Take 20 mg by mouth daily.     polyethylene glycol powder (GLYCOLAX/MIRALAX) 17 GM/SCOOP powder Take 17 g by mouth as needed.     tiZANidine (ZANAFLEX) 2 MG tablet Take 2 mg by mouth 3 (three) times daily.     [DISCONTINUED] PARoxetine (PAXIL) 30 MG tablet Take 30 mg by mouth every morning.       No current facility-administered medications on file prior to visit.    No  Known Allergies  Social History:  reports that she has never smoked. She has never used smokeless tobacco. She reports that she does not drink alcohol and does not use drugs.  Family History  Adopted: Yes  Problem Relation Age of Onset   Anesthesia problems Neg Hx     The following portions of the patient's history were reviewed and updated as appropriate: allergies, current medications, past family history, past medical history, past social history, past surgical history and problem list.  Review of Systems Pertinent items noted in HPI and remainder of comprehensive ROS otherwise negative.  Physical Exam:  BP 129/86   Pulse 81   Ht 5\' 4"  (1.626 m)   Wt 193 lb (87.5 kg)   LMP 07/21/2021 (Exact  Date)   BMI 33.13 kg/m  CONSTITUTIONAL: Well-developed, well-nourished female in no acute distress.  HENT:  Normocephalic, atraumatic, External right and left ear normal. Oropharynx is clear and moist EYES: Conjunctivae and EOM are normal.  NECK: Normal range of motion, supple, no masses.  Normal thyroid.  SKIN: Skin is warm and dry. No rash noted. Not diaphoretic. No erythema. No pallor. MUSCULOSKELETAL: Normal range of motion. No tenderness.  No cyanosis, clubbing, or edema.  2+ distal pulses. NEUROLOGIC: Alert and oriented to person, place, and time. Normal reflexes, muscle tone coordination.  PSYCHIATRIC: Normal mood and affect. Normal behavior. Normal judgment and thought content. CARDIOVASCULAR: Normal heart rate noted, regular rhythm RESPIRATORY: Clear to auscultation bilaterally. Effort and breath sounds normal, no problems with respiration noted. BREASTS: deferred ABDOMEN: Soft, no distention noted.  No tenderness, rebound or guarding.  PELVIC: Normal appearing external genitalia and urethral meatus; normal appearing vaginal mucosa and cervix.  No abnormal discharge noted.   Normal uterine size, no other palpable masses, no uterine or adnexal tenderness.  Performed in the presence of a chaperone.   Assessment and Plan:    1. Women's annual routine gynecological examination Normal annual exam.  Pt recently had physical with [facility] med  2. Screen for STD (sexually transmitted disease) Pt had remainder of STD testing including GC/C with [facility] - Hepatitis B surface antigen - Hepatitis C antibody  Mammogram performed this morning Routine preventative health maintenance measures emphasized. Please refer to After Visit Summary for other counseling recommendations.      [facility], MD, FACOG Obstetrician & Gynecologist, [facility] for [facility], [facility]

## 2021-08-05 LAB — HEPATITIS C ANTIBODY: Hep C Virus Ab: <0.1 s/co ratio (ref 0.0–0.9)

## 2021-08-05 LAB — HEPATITIS B SURFACE ANTIGEN: Hepatitis B Surface Ag: NEGATIVE

## 2022-04-10 ENCOUNTER — Other Ambulatory Visit: Payer: Self-pay | Admitting: Medical

## 2022-04-10 DIAGNOSIS — Z1231 Encounter for screening mammogram for malignant neoplasm of breast: Secondary | ICD-10-CM

## 2022-08-04 DIAGNOSIS — K219 Gastro-esophageal reflux disease without esophagitis: Secondary | ICD-10-CM | POA: Insufficient documentation

## 2022-08-05 ENCOUNTER — Ambulatory Visit
Admission: RE | Admit: 2022-08-05 | Discharge: 2022-08-05 | Disposition: A | Discharge status: Home / Self Care | Payer: Medicaid Other | Source: Ambulatory Visit | Attending: Medical | Admitting: Medical

## 2022-08-05 DIAGNOSIS — Z1231 Encounter for screening mammogram for malignant neoplasm of breast: Secondary | ICD-10-CM

## 2023-04-23 ENCOUNTER — Ambulatory Visit
Admission: EM | Admit: 2023-04-23 | Discharge: 2023-04-23 | Disposition: A | Discharge status: Home / Self Care | Payer: Medicaid Other | Attending: Family Medicine | Admitting: Family Medicine

## 2023-04-23 DIAGNOSIS — J029 Acute pharyngitis, unspecified: Secondary | ICD-10-CM | POA: Insufficient documentation

## 2023-04-23 LAB — POCT RAPID STREP A (OFFICE): Rapid Strep A Screen: NEGATIVE

## 2023-04-23 MED ORDER — IBUPROFEN 800 MG PO TABS: 800.0000 mg | ORAL_TABLET | Freq: Three times a day (TID) | ORAL | 0 refills | Status: DC | PRN

## 2023-04-23 MED ORDER — AMOXICILLIN 875 MG PO TABS: 875.0000 mg | ORAL_TABLET | Freq: Two times a day (BID) | ORAL | 0 refills | Status: AC

## 2023-04-23 NOTE — Discharge Instructions (Addendum)
Your strep test is negative.  Culture of the throat will be sent, and staff will notify you if that is in turn positive.  Take amoxicillin 875 mg--1 tab twice daily for 10 days; I want to go ahead and treat with antibiotics as your throat does have appearance of a bacterial infection.  It still could be a virus causing this illness.  Take ibuprofen 800 mg--1 tab every 8 hours as needed for pain.

## 2023-04-23 NOTE — ED Provider Notes (Signed)
[name]    CSN: [id number] Arrival date & time: 04/23/23  1533      History   Chief Complaint Chief Complaint  Patient presents with   Sore Throat   Headache   Otalgia    HPI Tiffany Floyd is a 42 y.o. female.    Sore Throat Associated symptoms include headaches.  Headache Associated symptoms: ear pain   Otalgia Associated symptoms: headaches    Here for sore throat and headache and right ear pain.  Symptoms began about 3 days ago.  No fever or chills or nausea or vomiting or diarrhea so far.  No cough and no nasal congestion.  Last menstrual cycle began today  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[facility])    Post traumatic stress disorder (PTSD)     Patient Active Problem List   Diagnosis Date Noted   Women's annual routine gynecological examination 08/04/2021   Screen for STD (sexually transmitted disease) 08/04/2021   Previous cesarean section 10/06/2012   Dyspnea 08/14/2011    Past Surgical History:  Procedure Laterality Date   CERVICAL CERCLAGE  12/02/2012   Procedure: CERCLAGE CERVICAL;  Surgeon: Tereso Newcomer, MD;  Location: [facility];  Service: Gynecology;  Laterality: N/A;   CERVICAL CERCLAGE N/A 02/20/2013   Procedure:  Removal of cervical cerclage;  Surgeon: Tilda Burrow, MD;  Location: [facility];  Service: Gynecology;  Laterality: N/A;   CESAREAN SECTION  03/10/2012   Procedure: CESAREAN SECTION;  Surgeon: Tereso Newcomer, MD;  Location: [facility];  Service: Gynecology;  Laterality: N/A;  Primary Cesarean Section Delivery Boy @ 2344,   DILATION AND EVACUATION N/A 03/01/2013   Procedure: DILATATION AND EVACUATION with Ultrasound;  Surgeon: Adam Phenix, MD;  [facility];  Service: Gynecology;  Laterality: N/A;   DILATION AND EVACUATION N/A 02/28/2013   Procedure: DILATATION AND EVACUATION;  Surgeon: Adam Phenix, MD;  [facility];  Service: Gynecology;  Laterality: N/A;   EXAMINATION  UNDER ANESTHESIA N/A 03/01/2013   Procedure: EXAM UNDER ANESTHESIA;  Surgeon: Adam Phenix, MD;  [facility];  Service: Gynecology;  Laterality: N/A;   TUBAL LIGATION Bilateral 02/21/2013   Procedure: POST PARTUM TUBAL LIGATION;  Surgeon: Willodean Rosenthal, MD;  Location: [facility];  Service: Gynecology;  Laterality: Bilateral;    OB History     Gravida  5   Para  3   Term  1   Preterm  2   AB  2   Living  2      SAB  1   IAB  1   Ectopic  0   Multiple  0   Live Births  3            Home Medications    Prior to Admission medications   Medication Sig Start Date End Date Taking? Authorizing Provider  amoxicillin (AMOXIL) 875 MG tablet Take 1 tablet (875 mg total) by mouth 2 (two) times daily for 10 days. 04/23/23 05/03/23 Yes Zenia Resides, MD  ibuprofen (ADVIL) 800 MG tablet Take 1 tablet (800 mg total) by mouth every 8 (eight) hours as needed (pain). 04/23/23  Yes Zenia Resides, MD  hydrOXYzine (ATARAX/VISTARIL) 25 MG tablet Take 25 mg by mouth as needed. 08/01/21   [provider]  loratadine (CLARITIN) 10 MG tablet Take 10 mg by mouth daily.    [provider]  omeprazole (PRILOSEC) 20 MG capsule Take 20 mg  by mouth daily. 08/01/21   [provider]  polyethylene glycol powder (GLYCOLAX/MIRALAX) 17 GM/SCOOP powder Take 17 g by mouth as needed. 07/20/19   [provider]  tiZANidine (ZANAFLEX) 2 MG tablet Take 2 mg by mouth 3 (three) times daily. 08/01/21   [provider]  PARoxetine (PAXIL) 30 MG tablet Take 30 mg by mouth every morning.    03/10/12  [provider]    Family History Family History  Adopted: Yes  Problem Relation Age of Onset   Anesthesia problems Neg Hx     Social History Social History   Tobacco Use   Smoking status: Never   Smokeless tobacco: Never  Substance Use Topics   Alcohol use: No   Drug use: No     Allergies   Patient has no known allergies.   Review of  Systems Review of Systems  HENT:  Positive for ear pain.   Neurological:  Positive for headaches.     Physical Exam Triage Vital Signs ED Triage Vitals [04/23/23 1559]  Enc Vitals Group     BP (!) 142/85     Pulse Rate 94     Resp 18     Temp 98.3 F (36.8 C)     Temp Source Oral     SpO2 97 %     Weight      Height      Head Circumference      Peak Flow      Pain Score 6     Pain Loc      Pain Edu?      Excl. in GC?    No data found.  Updated Vital Signs BP (!) 142/85 (BP Location: Left Arm)   Pulse 94   Temp 98.3 F (36.8 C) (Oral)   Resp 18   LMP 04/23/2023   SpO2 97%   Visual Acuity Right Eye Distance:   Left Eye Distance:   Bilateral Distance:    Right Eye Near:   Left Eye Near:    Bilateral Near:     Physical Exam Vitals reviewed.  Constitutional:      General: She is not in acute distress.    Appearance: She is not toxic-appearing.  HENT:     Right Ear: Tympanic membrane and ear canal normal.     Left Ear: Tympanic membrane and ear canal normal.     Nose: Nose normal.     Mouth/Throat:     Mouth: Mucous membranes are moist.     Comments: There is tonsillar hypertrophy bilaterally about 2+ with white exudate in the right tonsil. Eyes:     Extraocular Movements: Extraocular movements intact.     Conjunctiva/sclera: Conjunctivae normal.     Pupils: Pupils are equal, round, and reactive to light.  Cardiovascular:     Rate and Rhythm: Normal rate and regular rhythm.     Heart sounds: No murmur heard. Pulmonary:     Effort: Pulmonary effort is normal. No respiratory distress.     Breath sounds: No stridor. No wheezing, rhonchi or rales.  Musculoskeletal:     Cervical back: Neck supple.  Lymphadenopathy:     Cervical: No cervical adenopathy.  Skin:    Capillary Refill: Capillary refill takes less than 2 seconds.     Coloration: Skin is not jaundiced or pale.  Neurological:     General: No focal deficit present.     Mental Status: She is  alert and oriented to person, place, and  time.  Psychiatric:        Behavior: Behavior normal.      UC Treatments / Results  Labs (all labs ordered are listed, but only abnormal results are displayed) Labs Reviewed  CULTURE, GROUP A STREP [facility])  POCT RAPID STREP A (OFFICE)    EKG   Radiology No results found.  Procedures Procedures (including critical care time)  Medications Ordered in UC Medications - No data to display  Initial Impression / Assessment and Plan / UC Course  I have reviewed the triage vital signs and the nursing notes.  Pertinent labs & imaging results that were available during my care of the patient were reviewed by me and considered in my medical decision making (see chart for details).        Rapid strep is negative.  Throat culture is sent.  With the appearance of her throat and this being isolated pharyngitis symptoms, I am to go ahead and treat with amoxicillin. Final Clinical Impressions(s) / UC Diagnoses   Final diagnoses:  Acute pharyngitis, unspecified etiology     Discharge Instructions      Your strep test is negative.  Culture of the throat will be sent, and staff will notify you if that is in turn positive.  Take amoxicillin 875 mg--1 tab twice daily for 10 days; I want to go ahead and treat with antibiotics as your throat does have appearance of a bacterial infection.  It still could be a virus causing this illness.  Take ibuprofen 800 mg--1 tab every 8 hours as needed for pain.       ED Prescriptions     Medication Sig Dispense Auth. Provider   amoxicillin (AMOXIL) 875 MG tablet Take 1 tablet (875 mg total) by mouth 2 (two) times daily for 10 days. 20 tablet Zenia Resides, MD   ibuprofen (ADVIL) 800 MG tablet Take 1 tablet (800 mg total) by mouth every 8 (eight) hours as needed (pain). 21 tablet Carsyn Boster, Janace Aris, MD      PDMP not reviewed this encounter.   Zenia Resides, MD 04/23/23 [id number]

## 2023-04-23 NOTE — ED Triage Notes (Signed)
Pt presents with sore throat headache, and right ear pain since yesterday.

## 2023-04-24 LAB — CULTURE, GROUP A STREP (THRC)

## 2023-04-25 LAB — CULTURE, GROUP A STREP (THRC)

## 2023-05-12 ENCOUNTER — Other Ambulatory Visit: Payer: Self-pay | Admitting: Physician Assistant

## 2023-05-12 DIAGNOSIS — Z1231 Encounter for screening mammogram for malignant neoplasm of breast: Secondary | ICD-10-CM

## 2023-06-08 ENCOUNTER — Ambulatory Visit (INDEPENDENT_AMBULATORY_CARE_PROVIDER_SITE_OTHER): Payer: Medicaid Other | Admitting: Obstetrics and Gynecology

## 2023-06-08 ENCOUNTER — Encounter: Payer: Self-pay | Admitting: Obstetrics and Gynecology

## 2023-06-08 ENCOUNTER — Other Ambulatory Visit (HOSPITAL_COMMUNITY)
Admission: RE | Admit: 2023-06-08 | Discharge: 2023-06-08 | Disposition: A | Discharge status: Home / Self Care | Payer: Medicaid Other | Source: Ambulatory Visit | Attending: Obstetrics and Gynecology | Admitting: Obstetrics and Gynecology

## 2023-06-08 ENCOUNTER — Other Ambulatory Visit: Payer: Self-pay

## 2023-06-08 VITALS — BP 135/83 | HR 81 | Ht 63.5 in | Wt 198.7 lb

## 2023-06-08 DIAGNOSIS — Z124 Encounter for screening for malignant neoplasm of cervix: Secondary | ICD-10-CM

## 2023-06-08 DIAGNOSIS — Z01419 Encounter for gynecological examination (general) (routine) without abnormal findings: Secondary | ICD-10-CM

## 2023-06-08 DIAGNOSIS — Z113 Encounter for screening for infections with a predominantly sexual mode of transmission: Secondary | ICD-10-CM | POA: Diagnosis not present

## 2023-06-08 NOTE — Progress Notes (Addendum)
ANNUAL EXAM Patient name: Tiffany Floyd MRN [id number]  Date of birth: February 15, 1981 Chief Complaint:   Annual Exam  History of Present Illness:   Tiffany Floyd is a 42 y.o. [phone]  female being seen today for a routine annual exam.  Current complaints: no concerns today  Patient's last menstrual period was 05/12/2023 (exact date).   The pregnancy intention screening data noted above was reviewed. Potential methods of contraception were discussed. The patient elected to proceed with No data recorded.   Last pap 06/28/2020. Results were:  NILM . H/O abnormal pap: no Last mammogram: 2023. Results were: normal. Family h/o breast cancer: no Last colonoscopy:n/a Results were: N/A. Family h/o colorectal cancer: no     06/08/2023   10:37 AM 08/04/2021   10:18 AM 06/28/2020   10:49 AM 03/29/2017   10:48 AM  Depression screen PHQ 2/9  Decreased Interest 0 0 0 3  Down, Depressed, Hopeless 0 0 0 0  PHQ - 2 Score 0 0 0 3  Altered sleeping 0 0 0 2  Tired, decreased energy 0 0 0 0  Change in appetite 0 0 0 0  Feeling bad or failure about yourself  0 0 0 0  Trouble concentrating 0 0 0 0  Moving slowly or fidgety/restless 0 0 0 0  Suicidal thoughts 0 0 0 0  PHQ-9 Score 0 0 0 5        06/08/2023   10:37 AM 08/04/2021   10:19 AM 06/28/2020   10:50 AM 03/29/2017   10:48 AM  GAD 7 : Generalized Anxiety Score  Nervous, Anxious, on Edge 0 0 0 0  Control/stop worrying 0 0 2 0  Worry too much - different things 0 0 2 1  Trouble relaxing 0 0 0 2  Restless 0 0 0 0  Easily annoyed or irritable 0 0 3 3  Afraid - awful might happen 0 0 0 0  Total GAD 7 Score 0 0 7 6     Review of Systems:   Pertinent items are noted in HPI Denies any headaches, blurred vision, fatigue, shortness of breath, chest pain, abdominal pain, abnormal vaginal discharge/itching/odor/irritation, problems with periods, bowel movements, urination, or intercourse unless otherwise stated above. Pertinent History  Reviewed:  Reviewed past medical,surgical, social and family history.  Reviewed problem list, medications and allergies. Physical Assessment:   Vitals:   06/08/23 1034  BP: 135/83  Pulse: 81  Weight: 198 lb 11.2 oz (90.1 kg)  Height: 5' 3.5" (1.613 m)  Body mass index is 34.65 kg/m.        Physical Examination:   General appearance - well appearing, and in no distress  Mental status - alert, oriented to person, place, and time  Psych:  She has a normal mood and affect  Skin - warm and dry, normal color, no suspicious lesions noted  Chest - effort normal, all lung fields clear to auscultation bilaterally  Heart - normal rate and regular rhythm  Neck:  midline trachea, no thyromegaly or nodules  Breasts - breasts appear normal, no suspicious masses, no skin or nipple changes or  axillary nodes  Abdomen - soft, nontender, nondistended, no masses or organomegaly  Pelvic - VULVA: normal appearing vulva with no masses, tenderness or lesions  VAGINA: normal appearing vagina with normal color and discharge, no lesions  CERVIX: normal appearing cervix without discharge or lesions, no CMT  Thin prep pap is done with HR HPV cotesting  UTERUS: uterus is  felt to be normal size, shape, consistency and nontender   ADNEXA: No adnexal masses or tenderness noted.    Extremities:  No swelling or varicosities noted  Chaperone present for exam  No results found for this or any previous visit (from the past 24 hour(s)).  Assessment & Plan:   1. Well woman exam with routine gynecological exam   2. Screen for STD (sexually transmitted disease)  - Hepatitis B surface antigen - Hepatitis C antibody - HIV Antibody (routine testing w rflx) - RPR  3. Cervical cancer screening  - Cytology - PAP( [place])   Mammogram:  scheduled for August , or sooner if problems Colonoscopy: @ 43yo, or sooner if problems    Meds: No orders of the defined types were placed in this  encounter.   Follow-up: Return in one year for annual well woman Future Appointments  Date Time Provider Department Center  08/09/2023  8:00 AM [facility]    [place], [place] 06/08/2023 10:40 AM

## 2023-06-09 LAB — HIV ANTIBODY (ROUTINE TESTING W REFLEX): HIV Screen 4th Generation wRfx: NONREACTIVE

## 2023-06-09 LAB — HEPATITIS C ANTIBODY: Hep C Virus Ab: NONREACTIVE

## 2023-06-09 LAB — HEPATITIS B SURFACE ANTIGEN: Hepatitis B Surface Ag: NEGATIVE

## 2023-06-09 LAB — RPR: RPR Ser Ql: NONREACTIVE

## 2023-06-10 LAB — CYTOLOGY - PAP
Adequacy: ABSENT
Chlamydia: NEGATIVE
Comment: NEGATIVE
Comment: NEGATIVE
Comment: NEGATIVE
Comment: NORMAL
Diagnosis: NEGATIVE
High risk HPV: NEGATIVE
Neisseria Gonorrhea: NEGATIVE
Trichomonas: NEGATIVE

## 2023-08-09 ENCOUNTER — Ambulatory Visit: Payer: Medicaid Other

## 2023-08-09 ENCOUNTER — Ambulatory Visit
Admission: RE | Admit: 2023-08-09 | Discharge: 2023-08-09 | Disposition: A | Discharge status: Home / Self Care | Payer: Medicaid Other | Source: Ambulatory Visit | Attending: Physician Assistant | Admitting: Physician Assistant

## 2023-08-09 DIAGNOSIS — Z1231 Encounter for screening mammogram for malignant neoplasm of breast: Secondary | ICD-10-CM

## 2023-08-17 ENCOUNTER — Ambulatory Visit: Payer: Medicaid Other

## 2023-08-17 ENCOUNTER — Ambulatory Visit
Admission: EM | Admit: 2023-08-17 | Discharge: 2023-08-17 | Disposition: A | Discharge status: Home / Self Care | Payer: Medicaid Other | Attending: Internal Medicine | Admitting: Internal Medicine

## 2023-08-17 DIAGNOSIS — M79675 Pain in left toe(s): Secondary | ICD-10-CM

## 2023-08-17 DIAGNOSIS — S99922A Unspecified injury of left foot, initial encounter: Secondary | ICD-10-CM | POA: Diagnosis not present

## 2023-08-17 MED ORDER — IBUPROFEN 600 MG PO TABS: 600.0000 mg | ORAL_TABLET | Freq: Four times a day (QID) | ORAL | 0 refills | Status: DC | PRN

## 2023-08-17 MED ORDER — KETOROLAC TROMETHAMINE 30 MG/ML IJ SOLN
30.0000 mg | Freq: Once | INTRAMUSCULAR | Status: AC
  Administered 2023-08-17: 30 mg via INTRAMUSCULAR

## 2023-08-17 NOTE — Discharge Instructions (Signed)
Your x-ray was normal.  Recommend elevation and ice application.  You were given a shot today in urgent care to decrease pain and inflammation.  Do not take any ibuprofen, Advil, Aleve for at least 24 hours following injection.  After this, I have sent you ibuprofen to take as needed for pain.  Take with food to avoid stomach upset.  Follow-up with podiatrist at provided phone number if symptoms persist or worsen.

## 2023-08-17 NOTE — ED Provider Notes (Signed)
[name]    CSN: [id number] Arrival date & time: 08/17/23  0804      History   Chief Complaint No chief complaint on file.   HPI Tiffany Floyd is a 42 y.o. female.   Patient presents with injury to left toe that occurred a few days prior.  Reports that she was moving and accidentally hit her toe on the metal portion of the moving truck.  States that she has been having persistent pain that is not improving.  Denies any numbness or tingling.     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[facility])    Post traumatic stress disorder (PTSD)     Patient Active Problem List   Diagnosis Date Noted   Women's annual routine gynecological examination 08/04/2021   Screen for STD (sexually transmitted disease) 08/04/2021   Previous cesarean section 10/06/2012   Dyspnea 08/14/2011    Past Surgical History:  Procedure Laterality Date   CERVICAL CERCLAGE  12/02/2012   Procedure: CERCLAGE CERVICAL;  Surgeon: Tereso Newcomer, MD;  Location: [facility];  Service: Gynecology;  Laterality: N/A;   CERVICAL CERCLAGE N/A 02/20/2013   Procedure:  Removal of cervical cerclage;  Surgeon: Tilda Burrow, MD;  Location: [facility];  Service: Gynecology;  Laterality: N/A;   CESAREAN SECTION  03/10/2012   Procedure: CESAREAN SECTION;  Surgeon: Tereso Newcomer, MD;  Location: [facility];  Service: Gynecology;  Laterality: N/A;  Primary Cesarean Section Delivery Boy @ 2344,   DILATION AND EVACUATION N/A 03/01/2013   Procedure: DILATATION AND EVACUATION with Ultrasound;  Surgeon: Adam Phenix, MD;  Location: [facility];  Service: Gynecology;  Laterality: N/A;   DILATION AND EVACUATION N/A 02/28/2013   Procedure: DILATATION AND EVACUATION;  Surgeon: Adam Phenix, MD;  Location: [facility];  Service: Gynecology;  Laterality: N/A;   EXAMINATION UNDER ANESTHESIA N/A 03/01/2013   Procedure: EXAM UNDER ANESTHESIA;  Surgeon: Adam Phenix, MD;  Location: [facility];  Service:  Gynecology;  Laterality: N/A;   TUBAL LIGATION Bilateral 02/21/2013   Procedure: POST PARTUM TUBAL LIGATION;  Surgeon: Willodean Rosenthal, MD;  Location: [facility];  Service: Gynecology;  Laterality: Bilateral;    OB History     Gravida  5   Para  3   Term  1   Preterm  2   AB  2   Living  2      SAB  1   IAB  1   Ectopic  0   Multiple  0   Live Births  3            Home Medications    Prior to Admission medications   Medication Sig Start Date End Date Taking? Authorizing Provider  ibuprofen (ADVIL) 600 MG tablet Take 1 tablet (600 mg total) by mouth every 6 (six) hours as needed for mild pain or moderate pain. 08/17/23  Yes Doroteo Nickolson, Rolly Salter E, FNP  loratadine (CLARITIN) 10 MG tablet Take 10 mg by mouth daily. Patient not taking: Reported on 06/08/2023    [provider]  PARoxetine (PAXIL) 30 MG tablet Take 30 mg by mouth every morning.    03/10/12  [provider]    Family History Family History  Adopted: Yes  Problem Relation Age of Onset   Anesthesia problems Neg Hx     Social History Social History   Tobacco Use   Smoking status: Never   Smokeless tobacco: Never  Substance Use  Topics   Alcohol use: No   Drug use: No     Allergies   Patient has no known allergies.   Review of Systems Review of Systems Per HPI  Physical Exam Triage Vital Signs ED Triage Vitals  Encounter Vitals Group     BP 08/17/23 0855 (!) 138/90     Systolic BP Percentile --      Diastolic BP Percentile --      Pulse Rate 08/17/23 0855 72     Resp 08/17/23 0855 18     Temp 08/17/23 0855 97.8 F (36.6 C)     Temp Source 08/17/23 0855 Oral     SpO2 08/17/23 0855 97 %     Weight --      Height --      Head Circumference --      Peak Flow --      Pain Score 08/17/23 0859 6     Pain Loc --      Pain Education --      Exclude from Growth Chart --    No data found.  Updated Vital Signs BP (!) 138/90 (BP Location: Left Arm)   Pulse 72    Temp 97.8 F (36.6 C) (Oral)   Resp 18   LMP  (Within Months) Comment: 1 months  SpO2 97%   Visual Acuity Right Eye Distance:   Left Eye Distance:   Bilateral Distance:    Right Eye Near:   Left Eye Near:    Bilateral Near:     Physical Exam Constitutional:      General: She is not in acute distress.    Appearance: Normal appearance. She is not toxic-appearing or diaphoretic.  HENT:     Head: Normocephalic and atraumatic.  Eyes:     Extraocular Movements: Extraocular movements intact.     Conjunctiva/sclera: Conjunctivae normal.  Pulmonary:     Effort: Pulmonary effort is normal.  Feet:     Comments: Patient has tenderness to palpation with very mild swelling and bruising discoloration present to distal end of left great toe.  Nail is intact to nailbed.  No obvious subungual hematoma noted.  Capillary refill and pulses intact.  Patient can wiggle toes. No other tenderness to palpation to remainder of foot or toes. Neurological:     General: No focal deficit present.     Mental Status: She is alert and oriented to person, place, and time. Mental status is at baseline.  Psychiatric:        Mood and Affect: Mood normal.        Behavior: Behavior normal.        Thought Content: Thought content normal.        Judgment: Judgment normal.      UC Treatments / Results  Labs (all labs ordered are listed, but only abnormal results are displayed) Labs Reviewed - No data to display  EKG   Radiology DG Toe Great Left  Result Date: 08/17/2023 CLINICAL DATA:  Great toe pain, swelling. EXAM: LEFT GREAT TOE COMPARISON:  None Available. FINDINGS: There is no evidence of fracture or dislocation. There is no evidence of arthropathy or other focal bone abnormality. No erosive or bony destructive change. Soft tissue edema. No radiopaque foreign body or soft tissue gas. IMPRESSION: Soft tissue edema. No osseous abnormality. Electronically Signed   By: Narda Rutherford M.D.   On: 08/17/2023  09:49    Procedures Procedures (including critical care time)  Medications Ordered in UC Medications  ketorolac (TORADOL) 30 MG/ML injection 30 mg (30 mg Intramuscular Given 08/17/23 1008)    Initial Impression / Assessment and Plan / UC Course  I have reviewed the triage vital signs and the nursing notes.  Pertinent labs & imaging results that were available during my care of the patient were reviewed by me and considered in my medical decision making (see chart for details).     X-ray was negative for any acute bony abnormality.  Suspect contusion versus muscular strain/injury related to mechanism of injury.  Advised supportive care related to this.  Patient is requesting pain medication so IM Toradol was administered in urgent care today.  Advised no NSAIDs for at least 24 hours following injection.  Patient also prescribed prescription ibuprofen to take as needed after this 24 hours.  Advised following up with podiatry at provided phone number if symptoms persist or worsen.  Patient verbalized understanding and was agreeable with plan. Final Clinical Impressions(s) / UC Diagnoses   Final diagnoses:  Great toe pain, left  Injury of toe on left foot, initial encounter     Discharge Instructions      Your x-ray was normal.  Recommend elevation and ice application.  You were given a shot today in urgent care to decrease pain and inflammation.  Do not take any ibuprofen, Advil, Aleve for at least 24 hours following injection.  After this, I have sent you ibuprofen to take as needed for pain.  Take with food to avoid stomach upset.  Follow-up with podiatrist at provided phone number if symptoms persist or worsen.     ED Prescriptions     Medication Sig Dispense Auth. Provider   ibuprofen (ADVIL) 600 MG tablet Take 1 tablet (600 mg total) by mouth every 6 (six) hours as needed for mild pain or moderate pain. 30 tablet [place], Acie Fredrickson, [place]      PDMP not reviewed this encounter.    Gustavus Bryant, [place] 08/17/23 1034

## 2023-08-17 NOTE — ED Triage Notes (Signed)
Pt reports pain, swelling and turning black in right big toe.

## 2023-08-25 ENCOUNTER — Ambulatory Visit (INDEPENDENT_AMBULATORY_CARE_PROVIDER_SITE_OTHER): Payer: Medicaid Other | Admitting: Podiatry

## 2023-08-25 ENCOUNTER — Ambulatory Visit (INDEPENDENT_AMBULATORY_CARE_PROVIDER_SITE_OTHER): Payer: Medicaid Other

## 2023-08-25 ENCOUNTER — Encounter: Payer: Self-pay | Admitting: Podiatry

## 2023-08-25 DIAGNOSIS — S92912A Unspecified fracture of left toe(s), initial encounter for closed fracture: Secondary | ICD-10-CM | POA: Diagnosis not present

## 2023-08-25 DIAGNOSIS — S90112A Contusion of left great toe without damage to nail, initial encounter: Secondary | ICD-10-CM

## 2023-08-25 NOTE — Progress Notes (Signed)
   Chief Complaint  Patient presents with   Toe Injury    Patient is here for left toe injury    HPI: 42 y.o. female presenting today as a new patient for evaluation of an injury that was sustained to the left great toe.  Patient states that she dropped a moving ladder on her toe.  She went to the urgent care/emergency department and was diagnosed with contusion.  Negative for fracture.  She presents for follow-up treatment evaluation  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[facility])    Post traumatic stress disorder (PTSD)     Past Surgical History:  Procedure Laterality Date   CERVICAL CERCLAGE  12/02/2012   Procedure: CERCLAGE CERVICAL;  Surgeon: Tereso Newcomer, MD;  [facility]: [facility];  Service: Gynecology;  Laterality: N/A;   CERVICAL CERCLAGE N/A 02/20/2013   Procedure:  Removal of cervical cerclage;  Surgeon: Tilda Burrow, MD;  Location: [facility];  Service: Gynecology;  Laterality: N/A;   CESAREAN SECTION  03/10/2012   Procedure: CESAREAN SECTION;  Surgeon: Tereso Newcomer, MD;  Location: [facility];  Service: Gynecology;  Laterality: N/A;  Primary Cesarean Section Delivery Boy @ 2344,   DILATION AND EVACUATION N/A 03/01/2013   Procedure: DILATATION AND EVACUATION with Ultrasound;  Surgeon: Adam Phenix, MD;  Location: [facility];  Service: Gynecology;  Laterality: N/A;   DILATION AND EVACUATION N/A 02/28/2013   Procedure: DILATATION AND EVACUATION;  Surgeon: Adam Phenix, MD;  Location: [facility];  Service: Gynecology;  Laterality: N/A;   EXAMINATION UNDER ANESTHESIA N/A 03/01/2013   Procedure: EXAM UNDER ANESTHESIA;  Surgeon: Adam Phenix, MD;  Location: [facility];  Service: Gynecology;  Laterality: N/A;   TUBAL LIGATION Bilateral 02/21/2013   Procedure: POST PARTUM TUBAL LIGATION;  Surgeon: Willodean Rosenthal, MD;  Location: [facility];  Service: Gynecology;  Laterality: Bilateral;    No Known Allergies   Physical Exam: General: The patient is alert and  oriented x3 in no acute distress.  Dermatology: Skin is warm, dry and supple bilateral lower extremities.  There is some slight bruising to the subungual nail plate of the affected toe  Vascular: Palpable pedal pulses bilaterally. Capillary refill within normal limits.  No appreciable edema.  No erythema.  Neurological: Grossly intact via light touch  Musculoskeletal Exam: No pedal deformities noted.  Slight tenderness with palpation of the toe.  Radiographic Exam LT foot 08/25/2023:  Normal osseous mineralization. Joint spaces preserved.  No fractures or osseous irregularities noted.  Impression: Negative  Assessment/Plan of Care: 1.  Contusion left great toe with bruising to the subungual nail plate  -X-rays reviewed.  Negative for fracture -Continue Motrin as needed -Recommend good supportive shoes that do not irritate the toe -The nail plate should grow out and toe should resolve uneventfully -Return to clinic as needed      Felecia Shelling, DPM [facility]  Dr. Felecia Shelling, DPM    [address] [place], [place] [address]                Office [phone]  Fax [phone]

## 2024-04-11 ENCOUNTER — Ambulatory Visit

## 2024-04-11 ENCOUNTER — Other Ambulatory Visit: Payer: Self-pay | Admitting: Physician Assistant

## 2024-04-11 ENCOUNTER — Other Ambulatory Visit (HOSPITAL_COMMUNITY)
Admission: RE | Admit: 2024-04-11 | Discharge: 2024-04-11 | Disposition: A | Discharge status: Home / Self Care | Source: Ambulatory Visit | Attending: Family Medicine | Admitting: Family Medicine

## 2024-04-11 VITALS — BP 150/95 | HR 95 | Ht 65.0 in | Wt 196.0 lb

## 2024-04-11 DIAGNOSIS — Z113 Encounter for screening for infections with a predominantly sexual mode of transmission: Secondary | ICD-10-CM

## 2024-04-11 DIAGNOSIS — Z0000 Encounter for general adult medical examination without abnormal findings: Secondary | ICD-10-CM

## 2024-04-11 NOTE — Progress Notes (Signed)
 Tiffany Floyd is here with request to do a complete STI panel. Patient denies any symptoms but reports husband's possible exposure to STIs.   Self swab instructions given and specimen obtained along with blood STI labs. Explained to patient she will be contacted with any abnormal results. Patient is not due for annual exam. Next annual due is 05/2024. Patient will make an appointment at checkout.   Lennart Quitter, RN 04/11/2024  2:18 PM

## 2024-04-12 ENCOUNTER — Encounter: Payer: Self-pay | Admitting: Advanced Practice Midwife

## 2024-04-12 LAB — RPR+HBSAG+HCVAB+...
HIV Screen 4th Generation wRfx: NONREACTIVE
Hep C Virus Ab: NONREACTIVE
Hepatitis B Surface Ag: NEGATIVE
RPR Ser Ql: NONREACTIVE

## 2024-04-13 LAB — CERVICOVAGINAL ANCILLARY ONLY
Bacterial Vaginitis (gardnerella): POSITIVE — AB
Candida Glabrata: NEGATIVE
Candida Vaginitis: NEGATIVE
Chlamydia: NEGATIVE
Comment: NEGATIVE
Comment: NEGATIVE
Comment: NEGATIVE
Comment: NEGATIVE
Comment: NEGATIVE
Comment: NORMAL
Neisseria Gonorrhea: NEGATIVE
Trichomonas: NEGATIVE

## 2024-04-14 MED ORDER — METRONIDAZOLE 0.75 % VA GEL: 1.0000 | Freq: Every day | VAGINAL | 0 refills | Status: AC

## 2024-04-15 ENCOUNTER — Encounter: Payer: Self-pay | Admitting: Advanced Practice Midwife

## 2024-04-24 ENCOUNTER — Other Ambulatory Visit: Payer: Self-pay

## 2024-04-24 ENCOUNTER — Encounter: Payer: Self-pay | Admitting: *Deleted

## 2024-04-24 ENCOUNTER — Ambulatory Visit
Admission: EM | Admit: 2024-04-24 | Discharge: 2024-04-24 | Disposition: A | Discharge status: Home / Self Care | Attending: Family Medicine | Admitting: Family Medicine

## 2024-04-24 DIAGNOSIS — N760 Acute vaginitis: Secondary | ICD-10-CM

## 2024-04-24 DIAGNOSIS — Z113 Encounter for screening for infections with a predominantly sexual mode of transmission: Secondary | ICD-10-CM | POA: Diagnosis not present

## 2024-04-24 LAB — POCT URINALYSIS DIP (MANUAL ENTRY)
Bilirubin, UA: NEGATIVE
Glucose, UA: NEGATIVE mg/dL
Ketones, POC UA: NEGATIVE mg/dL
Leukocytes, UA: NEGATIVE
Nitrite, UA: NEGATIVE
Protein Ur, POC: NEGATIVE mg/dL
Spec Grav, UA: 1.025
Urobilinogen, UA: 0.2 U/dL
pH, UA: 5.5

## 2024-04-24 NOTE — ED Provider Notes (Signed)
 [name]    CSN: [id number] Arrival date & time: 04/24/24  1549      History   Chief Complaint Chief Complaint  Patient presents with   Urinary Frequency    HPI Tiffany Floyd is a 43 y.o. female.    Urinary Frequency  Patient here for evaluation urinary frequency x 2 days.  Treated for bacterial vaginosis and completed a course of treatment with metronidazole  gel.  Reports over the last few days she has had increased urinary frequency.  Endorses that she drinks lots of soda and not very much water and wonders if this may be the source of her current symptoms.  Denies any nausea, vomiting or normal vaginal discharge or odor.  Patient also denies any abdominal or flank pain.  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[facility])    Post traumatic stress disorder (PTSD)     Patient Active Problem List   Diagnosis Date Noted   Women's annual routine gynecological examination 08/04/2021   Screen for STD (sexually transmitted disease) 08/04/2021   Previous cesarean section 10/06/2012   Dyspnea 08/14/2011    Past Surgical History:  Procedure Laterality Date   CERVICAL CERCLAGE  12/02/2012   Procedure: CERCLAGE CERVICAL;  Surgeon: Julianne Octave, MD;  [facility]: [facility];  Service: Gynecology;  Laterality: N/A;   CERVICAL CERCLAGE N/A 02/20/2013   Procedure:  Removal of cervical cerclage;  Surgeon: Albino Hum, MD;  Location: [facility];  Service: Gynecology;  Laterality: N/A;   CESAREAN SECTION  03/10/2012   Procedure: CESAREAN SECTION;  Surgeon: Julianne Octave, MD;  Location: [facility];  Service: Gynecology;  Laterality: N/A;  Primary Cesarean Section Delivery Boy @ 2344,   DILATION AND EVACUATION N/A 03/01/2013   Procedure: DILATATION AND EVACUATION with Ultrasound;  Surgeon: Tresia Fruit, MD;  Location: [facility];  Service: Gynecology;  Laterality: N/A;   DILATION AND EVACUATION N/A 02/28/2013   Procedure: DILATATION AND EVACUATION;   Surgeon: Tresia Fruit, MD;  Location: [facility];  Service: Gynecology;  Laterality: N/A;   EXAMINATION UNDER ANESTHESIA N/A 03/01/2013   Procedure: EXAM UNDER ANESTHESIA;  Surgeon: Tresia Fruit, MD;  Location: [facility];  Service: Gynecology;  Laterality: N/A;   TUBAL LIGATION Bilateral 02/21/2013   Procedure: POST PARTUM TUBAL LIGATION;  Surgeon: Lenord Radon, MD;  Location: [facility];  Service: Gynecology;  Laterality: Bilateral;    OB History     Gravida  5   Para  3   Term  1   Preterm  2   AB  2   Living  2      SAB  1   IAB  1   Ectopic  0   Multiple  0   Live Births  3            Home Medications    Prior to Admission medications   Medication Sig Start Date End Date Taking? Authorizing Provider  hydrOXYzine (ATARAX) 25 MG tablet Take 25 mg by mouth daily.   Yes [provider]  metroNIDAZOLE  (METROGEL ) 0.75 % vaginal gel Place 1 Applicatorful vaginally at bedtime. Apply one applicatorful to vagina at bedtime for 5 days 04/14/24  Yes Izell Marsh, MD  cetirizine (ZYRTEC) 10 MG tablet Take 10 mg by mouth daily. Patient not taking: Reported on 04/11/2024    [provider]  ibuprofen  (ADVIL ) 600 MG tablet Take 1 tablet (600 mg total) by mouth every 6 (  six) hours as needed for mild pain or moderate pain. 08/17/23   Dodson Freestone, FNP  PARoxetine (PAXIL) 30 MG tablet Take 30 mg by mouth every morning.    03/10/12  [provider]    Family History Family History  Adopted: Yes  Problem Relation Age of Onset   Anesthesia problems Neg Hx     Social History Social History   Tobacco Use   Smoking status: Never   Smokeless tobacco: Never  Vaping Use   Vaping status: Never Used  Substance Use Topics   Alcohol use: No   Drug use: No     Allergies   Patient has no known allergies.   Review of Systems Review of Systems  Genitourinary:  Positive for frequency.     Physical Exam Triage Vital Signs ED Triage  Vitals  Encounter Vitals Group     BP 04/24/24 1729 (!) 146/93     Systolic BP Percentile --      Diastolic BP Percentile --      Pulse Rate 04/24/24 1729 87     Resp 04/24/24 1729 16     Temp 04/24/24 1729 98 F (36.7 C)     Temp src --      SpO2 04/24/24 1729 97 %     Weight --      Height --      Head Circumference --      Peak Flow --      Pain Score 04/24/24 1730 4     Pain Loc --      Pain Education --      Exclude from Growth Chart --    No data found.  Updated Vital Signs BP (!) 146/93 (BP Location: Left Arm)   Pulse 87   Temp 98 F (36.7 C)   Resp 16   LMP 04/03/2024   SpO2 97%   Visual Acuity Right Eye Distance:   Left Eye Distance:   Bilateral Distance:    Right Eye Near:   Left Eye Near:    Bilateral Near:     Physical Exam General appearance: Alert, well developed, well nourished, cooperative and in no distress Head: Normocephalic, without obvious abnormality, atraumatic Heart: Rate and Rhythm normal Respiratory: Respirations even and unlabored, normal respiratory rate CVA:  No flank pain Extremities: No gross deformities Skin: Skin color, texture, turgor normal. No rashes seen  Psych: Appropriate mood and affect.   UC Treatments / Results  Labs (all labs ordered are listed, but only abnormal results are displayed) Labs Reviewed  POCT URINALYSIS DIP (MANUAL ENTRY) - Abnormal; Notable for the following components:      Result Value   Blood, UA small (*)    All other components within normal limits  CERVICOVAGINAL ANCILLARY ONLY    EKG   Radiology No results found.  Procedures Procedures (including critical care time)  Medications Ordered in UC Medications - No data to display  Initial Impression / Assessment and Plan / UC Course  I have reviewed the triage vital signs and the nursing notes.  Pertinent labs & imaging results that were available during my care of the patient were reviewed by me and considered in my medical decision  making (see chart for details).    Vaginitis, irritation of unknown etiology.  Recently treated for BV.  Will obtain an updated cervical cytology to rule out any underlying vaginitis versus STI as source of symptoms.  UA obtained to rule out a UTI urinalysis negative  for any bacteria/WBCs indicates the presence of no UTI. Patient aware that office will only follow-up on abnormal lab results.  All results are viewable on MyChart. Final Clinical Impressions(s) / UC Diagnoses   Final diagnoses:  Vaginitis and vulvovaginitis  Screen for STD (sexually transmitted disease)     Discharge Instructions      Lab results take up to 3-4 days, however can result sooner.   Our office will only call if results are abnormal and require treatment. If you see a result and have questions feel free to reach out via phone and call this office directly and request to speak with the nurse.  All results will be available to view on MyChart.      ED Prescriptions   None    PDMP not reviewed this encounter.   Buena Carmine, NP 04/24/24 [phone]

## 2024-04-24 NOTE — Discharge Instructions (Signed)
 Lab results take up to 3-4 days, however can result sooner.   Our office will only call if results are abnormal and require treatment. If you see a result and have questions feel free to reach out via phone and call this office directly and request to speak with the nurse.  All results will be available to view on MyChart.

## 2024-04-24 NOTE — ED Triage Notes (Signed)
 Pt reports 2 days of urinary frequency. States she was dx with BV and was treated x 5 days vaginal cream. After that she tried a new OTC treatment to "balance" the bacteria and began having the urinary symptoms. States it "feels weird"

## 2024-04-25 LAB — CERVICOVAGINAL ANCILLARY ONLY
Bacterial Vaginitis (gardnerella): NEGATIVE
Candida Glabrata: NEGATIVE
Candida Vaginitis: NEGATIVE
Chlamydia: NEGATIVE
Comment: NEGATIVE
Comment: NEGATIVE
Comment: NEGATIVE
Comment: NEGATIVE
Comment: NEGATIVE
Comment: NORMAL
Neisseria Gonorrhea: NEGATIVE
Trichomonas: NEGATIVE

## 2024-04-26 ENCOUNTER — Ambulatory Visit

## 2024-05-25 ENCOUNTER — Ambulatory Visit: Admission: EM | Admit: 2024-05-25 | Discharge: 2024-05-25 | Disposition: A | Discharge status: Home / Self Care

## 2024-05-25 ENCOUNTER — Encounter: Payer: Self-pay | Admitting: Emergency Medicine

## 2024-05-25 DIAGNOSIS — J0120 Acute ethmoidal sinusitis, unspecified: Secondary | ICD-10-CM

## 2024-05-25 MED ORDER — AMOXICILLIN-POT CLAVULANATE 875-125 MG PO TABS: 1.0000 | ORAL_TABLET | Freq: Two times a day (BID) | ORAL | 0 refills | Status: AC

## 2024-05-25 NOTE — ED Triage Notes (Addendum)
 Pt reports nasal/head congestion and dry cough x4 days. Also notes itchy throat and L ear. Denies fevers or chills. Taking dayquil/nyquil with no relief.

## 2024-05-25 NOTE — Discharge Instructions (Addendum)
 Return if any problems.

## 2024-05-25 NOTE — ED Provider Notes (Signed)
 [name]    CSN: [id number] Arrival date & time: 05/25/24  1552      History   Chief Complaint Chief Complaint  Patient presents with   Cough   Nasal Congestion    HPI Chiquetta Langner is a 43 y.o. female.   The history is provided by the patient. No language interpreter was used.  Cough Cough characteristics:  Non-productive Severity:  Moderate Onset quality:  Gradual Timing:  Constant Progression:  Worsening Chronicity:  New Relieved by:  Nothing Ineffective treatments:  None tried   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(HCC)    Post traumatic stress disorder (PTSD)     Patient Active Problem List   Diagnosis Date Noted   Gastroesophageal reflux disease without esophagitis 08/04/2022   Women's annual routine gynecological examination 08/04/2021   Screen for STD (sexually transmitted disease) 08/04/2021   Chronic idiopathic constipation 07/20/2019   GAD (generalized anxiety disorder) 07/20/2019   Seasonal allergies 07/20/2019   Previous cesarean section 10/06/2012   Dyspnea 08/14/2011    Past Surgical History:  Procedure Laterality Date   CERVICAL CERCLAGE  12/02/2012   Procedure: CERCLAGE CERVICAL;  Surgeon: Julianne Octave, MD;  Location: [facility];  Service: Gynecology;  Laterality: N/A;   CERVICAL CERCLAGE N/A 02/20/2013   Procedure:  Removal of cervical cerclage;  Surgeon: Albino Hum, MD;  Location: [facility];  Service: Gynecology;  Laterality: N/A;   CESAREAN SECTION  03/10/2012   Procedure: CESAREAN SECTION;  Surgeon: Julianne Octave, MD;  Location: [facility];  Service: Gynecology;  Laterality: N/A;  Primary Cesarean Section Delivery Boy @ 2344,   DILATION AND EVACUATION N/A 03/01/2013   Procedure: DILATATION AND EVACUATION with Ultrasound;  Surgeon: Tresia Fruit, MD;  Location: [facility];  Service: Gynecology;  Laterality: N/A;   DILATION AND EVACUATION N/A 02/28/2013   Procedure: DILATATION AND  EVACUATION;  Surgeon: Tresia Fruit, MD;  Location: [facility];  Service: Gynecology;  Laterality: N/A;   EXAMINATION UNDER ANESTHESIA N/A 03/01/2013   Procedure: EXAM UNDER ANESTHESIA;  Surgeon: Tresia Fruit, MD;  Location: [facility];  Service: Gynecology;  Laterality: N/A;   TUBAL LIGATION Bilateral 02/21/2013   Procedure: POST PARTUM TUBAL LIGATION;  Surgeon: Lenord Radon, MD;  Location: [facility];  Service: Gynecology;  Laterality: Bilateral;    OB History     Gravida  5   Para  3   Term  1   Preterm  2   AB  2   Living  2      SAB  1   IAB  1   Ectopic  0   Multiple  0   Live Births  3            Home Medications    Prior to Admission medications   Medication Sig Start Date End Date Taking? Authorizing Provider  amoxicillin -clavulanate (AUGMENTIN) 875-125 MG tablet Take 1 tablet by mouth 2 (two) times daily. 05/25/24  Yes Bleu Minerd K, PA-C  cetirizine (ZYRTEC) 10 MG tablet Take 10 mg by mouth daily.   Yes [provider]  hydrOXYzine (ATARAX) 25 MG tablet Take 25 mg by mouth daily.   Yes [provider]  ibuprofen  (ADVIL ) 800 MG tablet Take by mouth daily.   Yes [provider]  ibuprofen  (ADVIL ) 600 MG tablet Take 1 tablet (600 mg total) by mouth every 6 (six) hours as needed for mild pain or moderate pain.  Patient not taking: Reported on 05/25/2024 08/17/23   Dodson Freestone, FNP  metroNIDAZOLE  (METROGEL ) 0.75 % vaginal gel Place 1 Applicatorful vaginally at bedtime. Apply one applicatorful to vagina at bedtime for 5 days Patient not taking: Reported on 05/25/2024 04/14/24   Izell Marsh, MD  PARoxetine (PAXIL) 30 MG tablet Take 30 mg by mouth every morning.    03/10/12  [provider]    Family History Family History  Adopted: Yes  Problem Relation Age of Onset   Anesthesia problems Neg Hx     Social History Social History   Tobacco Use   Smoking status: Never   Smokeless tobacco: Never  Vaping Use    Vaping status: Never Used  Substance Use Topics   Alcohol use: No   Drug use: No     Allergies   Patient has no known allergies.   Review of Systems Review of Systems  Respiratory:  Positive for cough.   All other systems reviewed and are negative.    Physical Exam Triage Vital Signs ED Triage Vitals [05/25/24 1610]  Encounter Vitals Group     BP 125/78     Systolic BP Percentile      Diastolic BP Percentile      Pulse Rate 73     Resp 14     Temp 98 F (36.7 C)     Temp Source Oral     SpO2 98 %     Weight      Height      Head Circumference      Peak Flow      Pain Score 8     Pain Loc      Pain Education      Exclude from Growth Chart    No data found.  Updated Vital Signs BP 125/78 (BP Location: Right Arm)   Pulse 73   Temp 98 F (36.7 C) (Oral)   Resp 14   LMP 05/22/2024 (Exact Date)   SpO2 98%   Visual Acuity Right Eye Distance:   Left Eye Distance:   Bilateral Distance:    Right Eye Near:   Left Eye Near:    Bilateral Near:     Physical Exam Constitutional:      Appearance: She is well-developed.  HENT:     Head: Normocephalic and atraumatic.     Right Ear: Tympanic membrane normal.     Left Ear: Tympanic membrane normal.     Nose: Rhinorrhea present.     Mouth/Throat:     Pharynx: Posterior oropharyngeal erythema present.  Eyes:     Conjunctiva/sclera: Conjunctivae normal.     Pupils: Pupils are equal, round, and reactive to light.  Pulmonary:     Effort: Pulmonary effort is normal.  Abdominal:     Palpations: Abdomen is soft.  Musculoskeletal:        General: Normal range of motion.     Cervical back: Normal range of motion and neck supple.  Skin:    General: Skin is warm and dry.  Neurological:     Mental Status: She is alert and oriented to person, place, and time.      UC Treatments / Results  Labs (all labs ordered are listed, but only abnormal results are displayed) Labs Reviewed - No data to  display  EKG   Radiology No results found.  Procedures Procedures (including critical care time)  Medications Ordered in UC Medications - No data to display  Initial Impression /  Assessment and Plan / UC Course  I have reviewed the triage vital signs and the nursing notes.  Pertinent labs & imaging results that were available during my care of the patient were reviewed by me and considered in my medical decision making (see chart for details).     Patient complains of sinus pressure congestion and drainage.  She has been symptomatic for the past week.  Patient given a prescription for Augmentin.  She is counseled on sinusitis symptoms and treatment Final Clinical Impressions(s) / UC Diagnoses   Final diagnoses:  Acute non-recurrent ethmoidal sinusitis   Discharge Instructions      Return if any problems.    ED Prescriptions     Medication Sig Dispense Auth. Provider   amoxicillin -clavulanate (AUGMENTIN) 875-125 MG tablet Take 1 tablet by mouth 2 (two) times daily. 20 tablet Cayci Mcnabb K, PA-C      PDMP not reviewed this encounter. An After Visit Summary was printed and given to the patient.       Sandi Crosby, PA-C 05/25/24 1845

## 2024-05-29 ENCOUNTER — Other Ambulatory Visit: Payer: Self-pay

## 2024-05-29 ENCOUNTER — Encounter: Payer: Self-pay | Admitting: Obstetrics and Gynecology

## 2024-05-29 ENCOUNTER — Other Ambulatory Visit (HOSPITAL_COMMUNITY)
Admission: RE | Admit: 2024-05-29 | Discharge: 2024-05-29 | Disposition: A | Discharge status: Home / Self Care | Source: Ambulatory Visit | Attending: Obstetrics and Gynecology | Admitting: Obstetrics and Gynecology

## 2024-05-29 ENCOUNTER — Ambulatory Visit: Admitting: Obstetrics and Gynecology

## 2024-05-29 VITALS — BP 126/85 | HR 80 | Ht 63.0 in | Wt 196.8 lb

## 2024-05-29 DIAGNOSIS — Z113 Encounter for screening for infections with a predominantly sexual mode of transmission: Secondary | ICD-10-CM | POA: Insufficient documentation

## 2024-05-29 DIAGNOSIS — Z01419 Encounter for gynecological examination (general) (routine) without abnormal findings: Secondary | ICD-10-CM | POA: Diagnosis not present

## 2024-05-29 NOTE — Progress Notes (Signed)
 ANNUAL EXAM Patient name: Tiffany Floyd MRN [id number]  Date of birth: 25-Mar-1981 Chief Complaint:   Gynecologic Exam  History of Present Illness:   Tiffany Floyd is a 43 y.o. [phone]  female being seen today for a routine annual exam.  Current complaints: period shorter than it used to be, used to be 7 days, now 3 days.  S/p BTL 2014. No other vaginal or pelvic complaints. Has a mammogram coming up in August   Patient's last menstrual period was 05/22/2024 (exact date).  Last pap 05/29/23. Results were: NILM w/ HRHPV negative. H/O abnormal pap: no Last mammogram: 07/2023. Results were: normal. Family h/o breast cancer: no Last colonoscopy: n/a. Results were: N/A. Family h/o colorectal cancer: no     06/08/2023   10:37 AM 08/04/2021   10:18 AM 06/28/2020   10:49 AM 03/29/2017   10:48 AM  Depression screen PHQ 2/9  Decreased Interest 0 0 0 3  Down, Depressed, Hopeless 0 0 0 0  PHQ - 2 Score 0 0 0 3  Altered sleeping 0 0 0 2  Tired, decreased energy 0 0 0 0  Change in appetite 0 0 0 0  Feeling bad or failure about yourself  0 0 0 0  Trouble concentrating 0 0 0 0  Moving slowly or fidgety/restless 0 0 0 0  Suicidal thoughts 0 0 0 0  PHQ-9 Score 0 0 0 5        06/08/2023   10:37 AM 08/04/2021   10:19 AM 06/28/2020   10:50 AM 03/29/2017   10:48 AM  GAD 7 : Generalized Anxiety Score  Nervous, Anxious, on Edge 0 0 0 0  Control/stop worrying 0 0 2 0  Worry too much - different things 0 0 2 1  Trouble relaxing 0 0 0 2  Restless 0 0 0 0  Easily annoyed or irritable 0 0 3 3  Afraid - awful might happen 0 0 0 0  Total GAD 7 Score 0 0 7 6     Review of Systems:   Pertinent items are noted in HPI Denies any headaches, blurred vision, fatigue, shortness of breath, chest pain, abdominal pain, abnormal vaginal discharge/itching/odor/irritation, problems with periods, bowel movements, urination, or intercourse unless otherwise stated above. Pertinent History Reviewed:   Reviewed past medical,surgical, social and family history.  Reviewed problem list, medications and allergies. Physical Assessment:   Vitals:   05/29/24 1326  BP: 126/85  Pulse: 80  Weight: 196 lb 12.8 oz (89.3 kg)  Height: 5\' 3"  (1.6 m)  Body mass index is 34.86 kg/m.        Physical Examination:   General appearance - well appearing, and in no distress  Mental status - alert, oriented   Psych:  She has a normal mood and affect  Skin - warm and dry, normal color  Chest - effort normal, all lung fields clear to auscultation bilaterally  Heart - normal rate and regular rhythm  Neck:  midline trachea  Breasts - breasts appear normal, no suspicious masses, no skin or nipple changes or  axillary nodes  Abdomen - soft, nontender  Pelvic - deferred  Extremities:  No swelling or varicosities noted  Chaperone present for exam  No results found for this or any previous visit (from the past 24 hours).  Assessment & Plan:  1. Encounter for annual routine gynecological examination (Primary) Discussed differentials for changes in cycle UTD on pap smear Mammogram in August   2. Screening  examination for STD (sexually transmitted disease) Self swab collected - Cervicovaginal ancillary only    Labs/procedures today:   Mammogram: scheduled for august, or sooner if problems Colonoscopy: @ 45yo, or sooner if problems  No orders of the defined types were placed in this encounter.   Meds: No orders of the defined types were placed in this encounter.   Follow-up: Return in about 1 year (around 05/29/2025) for [name], FNP

## 2024-05-31 LAB — CERVICOVAGINAL ANCILLARY ONLY
Bacterial Vaginitis (gardnerella): NEGATIVE
Chlamydia: NEGATIVE
Comment: NEGATIVE
Comment: NEGATIVE
Comment: NEGATIVE
Comment: NORMAL
Neisseria Gonorrhea: NEGATIVE
Trichomonas: NEGATIVE

## 2024-06-01 ENCOUNTER — Ambulatory Visit: Payer: Self-pay | Admitting: Obstetrics and Gynecology

## 2024-08-09 ENCOUNTER — Ambulatory Visit
Admission: RE | Admit: 2024-08-09 | Discharge: 2024-08-09 | Disposition: A | Discharge status: Home / Self Care | Source: Ambulatory Visit | Attending: Physician Assistant | Admitting: Physician Assistant

## 2024-08-09 DIAGNOSIS — Z0000 Encounter for general adult medical examination without abnormal findings: Secondary | ICD-10-CM

## 2024-12-16 ENCOUNTER — Ambulatory Visit
Admission: EM | Admit: 2024-12-16 | Discharge: 2024-12-16 | Disposition: A | Discharge status: Home / Self Care | Attending: Internal Medicine | Admitting: Internal Medicine

## 2024-12-16 ENCOUNTER — Encounter: Payer: Self-pay | Admitting: Emergency Medicine

## 2024-12-16 DIAGNOSIS — J069 Acute upper respiratory infection, unspecified: Secondary | ICD-10-CM | POA: Diagnosis not present

## 2024-12-16 LAB — POC COVID19/FLU A&B COMBO
Covid Antigen, POC: NEGATIVE
Influenza A Antigen, POC: NEGATIVE
Influenza B Antigen, POC: NEGATIVE

## 2024-12-16 MED ORDER — IBUPROFEN 600 MG PO TABS: 600.0000 mg | ORAL_TABLET | Freq: Four times a day (QID) | ORAL | 0 refills | Status: AC | PRN

## 2024-12-16 MED ORDER — HYDROCODONE BIT-HOMATROP MBR 5-1.5 MG/5ML PO SOLN: 5.0000 mL | Freq: Four times a day (QID) | ORAL | 0 refills | Status: AC | PRN

## 2024-12-16 NOTE — ED Provider Notes (Signed)
 [name]    CSN: [id number] Arrival date & time: 12/16/24  9163      History   Chief Complaint Chief Complaint  Patient presents with   URI    HPI Tiffany Floyd is a 43 y.o. female.   43 year old female presents urgent care with complaints of productive cough, left ear pain and sore throat.  She reports her symptoms started 2 days ago.  Her symptoms have worsened especially the sore throat and cough.  She is having hard time sleeping at night due to the severity of her cough at night.  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  She denies any nausea or vomiting.  She has not run a fever that she knows of.  She generally feels unwell.   URI Presenting symptoms: cough and ear pain   Presenting symptoms: no fever and no sore throat   Associated symptoms: no arthralgias     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(HCC)    Post traumatic stress disorder (PTSD)     Patient Active Problem List   Diagnosis Date Noted   Gastroesophageal reflux disease without esophagitis 08/04/2022   Chronic idiopathic constipation 07/20/2019   GAD (generalized anxiety disorder) 07/20/2019   Seasonal allergies 07/20/2019   Previous cesarean section 10/06/2012   Dyspnea 08/14/2011    Past Surgical History:  Procedure Laterality Date   CERVICAL CERCLAGE  12/02/2012   Procedure: CERCLAGE CERVICAL;  Surgeon: Gloris DELENA Hugger, MD;  Location: [facility];  Service: Gynecology;  Laterality: N/A;   CERVICAL CERCLAGE N/A 02/20/2013   Procedure:  Removal of cervical cerclage;  Surgeon: Norleen LULLA Server, MD;  Location: [facility];  Service: Gynecology;  Laterality: N/A;   CESAREAN SECTION  03/10/2012   Procedure: CESAREAN SECTION;  Surgeon: Gloris DELENA Hugger, MD;  Location: [facility];  Service: Gynecology;  Laterality: N/A;  Primary Cesarean Section Delivery Boy @ 2344,   DILATION AND EVACUATION N/A 03/01/2013    Procedure: DILATATION AND EVACUATION with Ultrasound;  Surgeon: Lynwood KANDICE Solomons, MD;  Location: [facility];  Service: Gynecology;  Laterality: N/A;   DILATION AND EVACUATION N/A 02/28/2013   Procedure: DILATATION AND EVACUATION;  Surgeon: Lynwood KANDICE Solomons, MD;  Location: [facility];  Service: Gynecology;  Laterality: N/A;   EXAMINATION UNDER ANESTHESIA N/A 03/01/2013   Procedure: EXAM UNDER ANESTHESIA;  Surgeon: Lynwood KANDICE Solomons, MD;  Location: [facility];  Service: Gynecology;  Laterality: N/A;   TUBAL LIGATION Bilateral 02/21/2013   Procedure: POST PARTUM TUBAL LIGATION;  Surgeon: Elveria Mungo, MD;  Location: [facility];  Service: Gynecology;  Laterality: Bilateral;    OB History     Gravida  5   Para  3   Term  1   Preterm  2   AB  2   Living  2      SAB  1   IAB  1   Ectopic  0   Multiple  0   Live Births  3            Home Medications    Prior to Admission medications  Medication Sig Start Date End Date Taking? Authorizing Provider  HYDROcodone  bit-homatropine (HYCODAN) 5-1.5 MG/5ML syrup Take 5 mLs by mouth every 6 (six) hours as needed for cough. 12/16/24  Yes Kalei Meda A, PA-C  ibuprofen  (ADVIL ) 600 MG tablet Take 1 tablet (600 mg total) by mouth every  6 (six) hours as needed. 12/16/24  Yes Halley Kincer A, PA-C  amoxicillin -clavulanate (AUGMENTIN ) 875-125 MG tablet Take 1 tablet by mouth 2 (two) times daily. 05/25/24   Sofia, Leslie K, PA-C  cetirizine (ZYRTEC) 10 MG tablet Take 10 mg by mouth daily.    [provider]  hydrOXYzine (ATARAX) 25 MG tablet Take 25 mg by mouth daily.    [provider]  metroNIDAZOLE  (METROGEL ) 0.75 % vaginal gel Place 1 Applicatorful vaginally at bedtime. Apply one applicatorful to vagina at bedtime for 5 days Patient not taking: Reported on 05/29/2024 04/14/24   Erik Kieth BROCKS, MD  PARoxetine (PAXIL) 30 MG tablet Take 30 mg by mouth every morning.    03/10/12  [provider]    Family History Family  History  Adopted: Yes  Problem Relation Age of Onset   Anesthesia problems Neg Hx     Social History Social History[1]   Allergies   Amoxicillin    Review of Systems Review of Systems  Constitutional:  Negative for chills and fever.  HENT:  Positive for ear pain. Negative for sore throat.   Eyes:  Negative for pain and visual disturbance.  Respiratory:  Positive for cough. Negative for shortness of breath.   Cardiovascular:  Negative for chest pain and palpitations.  Gastrointestinal:  Negative for abdominal pain and vomiting.  Genitourinary:  Negative for dysuria and hematuria.  Musculoskeletal:  Negative for arthralgias and back pain.  Skin:  Negative for color change and rash.  Neurological:  Negative for seizures and syncope.  All other systems reviewed and are negative.    Physical Exam Triage Vital Signs ED Triage Vitals  Encounter Vitals Group     BP 12/16/24 1012 (!) 161/93     Girls Systolic BP Percentile --      Girls Diastolic BP Percentile --      Boys Systolic BP Percentile --      Boys Diastolic BP Percentile --      Pulse Rate 12/16/24 1012 (!) 103     Resp 12/16/24 1012 18     Temp 12/16/24 1012 98.8 F (37.1 C)     Temp Source 12/16/24 1012 Oral     SpO2 12/16/24 1012 98 %     Weight 12/16/24 1012 196 lb 13.9 oz (89.3 kg)     Height --      Head Circumference --      Peak Flow --      Pain Score 12/16/24 1008 6     Pain Loc --      Pain Education --      Exclude from Growth Chart --    No data found.  Updated Vital Signs BP (!) 161/93 (BP Location: Left Arm)   Pulse (!) 103   Temp 98.8 F (37.1 C) (Oral)   Resp 18   Wt 196 lb 13.9 oz (89.3 kg)   LMP 11/23/2024 (Approximate)   SpO2 98%   BMI 34.87 kg/m   Visual Acuity Right Eye Distance:   Left Eye Distance:   Bilateral Distance:    Right Eye Near:   Left Eye Near:    Bilateral Near:     Physical Exam Vitals and nursing note reviewed.  Constitutional:      General: She is  not in acute distress.    Appearance: She is well-developed.  HENT:     Head: Normocephalic and atraumatic.     Right Ear: Tympanic membrane normal.     Left  Ear: Tympanic membrane normal.     Nose: Nose normal.     Mouth/Throat:     Pharynx: Posterior oropharyngeal erythema present.  Eyes:     Conjunctiva/sclera: Conjunctivae normal.  Cardiovascular:     Rate and Rhythm: Normal rate and regular rhythm.     Heart sounds: No murmur heard. Pulmonary:     Effort: Pulmonary effort is normal. No respiratory distress.     Breath sounds: Normal breath sounds.  Abdominal:     Palpations: Abdomen is soft.     Tenderness: There is no abdominal tenderness.  Musculoskeletal:        General: No swelling.     Cervical back: Neck supple.  Skin:    General: Skin is warm and dry.     Capillary Refill: Capillary refill takes less than 2 seconds.  Neurological:     General: No focal deficit present.     Mental Status: She is alert.  Psychiatric:        Mood and Affect: Mood normal.      UC Treatments / Results  Labs (all labs ordered are listed, but only abnormal results are displayed) Labs Reviewed  POC COVID19/FLU A&B COMBO - Normal    EKG   Radiology No results found.  Procedures Procedures (including critical care time)  Medications Ordered in UC Medications - No data to display  Initial Impression / Assessment and Plan / UC Course  I have reviewed the triage vital signs and the nursing notes.  Pertinent labs & imaging results that were available during my care of the patient were reviewed by me and considered in my medical decision making (see chart for details).     Viral upper respiratory tract infection with cough   Flu A, flu B and COVID testing done today is negative.  Physical exam findings, symptoms and duration of symptoms are most consistent with a viral infection.  This does not require antibiotic treatment.  We focus treatment on improving the symptoms.  We  will treat with the following:  Hycodan (hydrocodone  homatropine) take 5 mL every 6 hours as needed for cough.  Use caution as this medication can cause drowsiness. Ibuprofen  600 mg every 6 hours as needed for pain/fever Can use a humidifier in your room at night to help with symptoms Rest and Make sure to stay hydrated by drinking plenty of water.  Return to urgent care or PCP if symptoms worsen or fail to resolve.    Final Clinical Impressions(s) / UC Diagnoses   Final diagnoses:  Viral upper respiratory tract infection with cough     Discharge Instructions      Flu A, flu B and COVID testing done today is negative.  Physical exam findings, symptoms and duration of symptoms are most consistent with a viral infection.  This does not require antibiotic treatment.  We focus treatment on improving the symptoms.  We will treat with the following:  Hycodan (hydrocodone  homatropine) take 5 mL every 6 hours as needed for cough.  Use caution as this medication can cause drowsiness. Ibuprofen  600 mg every 6 hours as needed for pain/fever Can use a humidifier in your room at night to help with symptoms Rest and Make sure to stay hydrated by drinking plenty of water.  Return to urgent care or PCP if symptoms worsen or fail to resolve.      ED Prescriptions     Medication Sig Dispense Auth. Provider   HYDROcodone  bit-homatropine (HYCODAN) 5-1.5 MG/5ML syrup  Take 5 mLs by mouth every 6 (six) hours as needed for cough. 120 mL Markus Casten A, PA-C   ibuprofen  (ADVIL ) 600 MG tablet Take 1 tablet (600 mg total) by mouth every 6 (six) hours as needed. 30 tablet Teresa Almarie LABOR, [place]      PDMP not reviewed this encounter.    [1]  Social History Tobacco Use   Smoking status: Never    Passive exposure: Never   Smokeless tobacco: Never  Vaping Use   Vaping status: Never Used  Substance Use Topics   Alcohol use: No   Drug use: No     Teresa Almarie LABOR, PA-C 12/16/24 1059  "

## 2024-12-16 NOTE — Discharge Instructions (Addendum)
 Flu A, flu B and COVID testing done today is negative.  Physical exam findings, symptoms and duration of symptoms are most consistent with a viral infection.  This does not require antibiotic treatment.  We focus treatment on improving the symptoms.  We will treat with the following:  Hycodan (hydrocodone  homatropine) take 5 mL every 6 hours as needed for cough.  Use caution as this medication can cause drowsiness. Ibuprofen  600 mg every 6 hours as needed for pain/fever Can use a humidifier in your room at night to help with symptoms Rest and Make sure to stay hydrated by drinking plenty of water.  Return to urgent care or PCP if symptoms worsen or fail to resolve.

## 2024-12-16 NOTE — ED Triage Notes (Signed)
 Pt presents c/o productive cough, congestion, L otalgia x 2 days. Pt states,  I got a cough, I'm losing my voice, my chest is congested and I'm coughing up mucus. I also have sore throat and ear pain in my left ear.  Pt denies emesis and diarrhea.
